# Patient Record
Sex: Female | Born: 1960 | Race: White | Hispanic: No | Marital: Single | Smoking: Never smoker
Health system: Southern US, Community
[De-identification: ages and names within clinical notes are randomized; demographics above are authoritative.]

## PROBLEM LIST (undated history)

## (undated) DIAGNOSIS — E079 Disorder of thyroid, unspecified: Secondary | ICD-10-CM

## (undated) DIAGNOSIS — E669 Obesity, unspecified: Secondary | ICD-10-CM

## (undated) DIAGNOSIS — G4730 Sleep apnea, unspecified: Secondary | ICD-10-CM

## (undated) DIAGNOSIS — L509 Urticaria, unspecified: Secondary | ICD-10-CM

## (undated) DIAGNOSIS — T783XXA Angioneurotic edema, initial encounter: Secondary | ICD-10-CM

## (undated) DIAGNOSIS — E039 Hypothyroidism, unspecified: Secondary | ICD-10-CM

## (undated) HISTORY — PX: THYROIDECTOMY: SHX17

## (undated) HISTORY — DX: Hypothyroidism, unspecified: E03.9

## (undated) HISTORY — PX: KNEE SURGERY: SHX244

## (undated) HISTORY — DX: Disorder of thyroid, unspecified: E07.9

## (undated) HISTORY — DX: Sleep apnea, unspecified: G47.30

## (undated) HISTORY — DX: Obesity, unspecified: E66.9

## (undated) HISTORY — DX: Urticaria, unspecified: L50.9

## (undated) HISTORY — DX: Angioneurotic edema, initial encounter: T78.3XXA

---

## 1998-01-25 ENCOUNTER — Encounter: Payer: Self-pay | Admitting: Family Medicine

## 1998-01-25 ENCOUNTER — Ambulatory Visit (HOSPITAL_COMMUNITY): Admission: RE | Admit: 1998-01-25 | Discharge: 1998-01-25 | Payer: Self-pay | Admitting: Family Medicine

## 1998-03-04 ENCOUNTER — Encounter: Payer: Self-pay | Admitting: Family Medicine

## 1998-03-04 ENCOUNTER — Ambulatory Visit (HOSPITAL_COMMUNITY): Admission: RE | Admit: 1998-03-04 | Discharge: 1998-03-04 | Payer: Self-pay | Admitting: Family Medicine

## 1998-08-29 ENCOUNTER — Ambulatory Visit: Admission: RE | Admit: 1998-08-29 | Discharge: 1998-08-29 | Payer: Self-pay | Admitting: Family Medicine

## 1999-09-13 ENCOUNTER — Ambulatory Visit (HOSPITAL_COMMUNITY): Admission: RE | Admit: 1999-09-13 | Discharge: 1999-09-13 | Payer: Self-pay | Admitting: Family Medicine

## 1999-09-13 ENCOUNTER — Encounter: Payer: Self-pay | Admitting: Family Medicine

## 1999-09-27 ENCOUNTER — Encounter (INDEPENDENT_AMBULATORY_CARE_PROVIDER_SITE_OTHER): Payer: Self-pay | Admitting: Specialist

## 1999-09-27 ENCOUNTER — Encounter: Payer: Self-pay | Admitting: Surgery

## 1999-09-27 ENCOUNTER — Ambulatory Visit (HOSPITAL_COMMUNITY): Admission: RE | Admit: 1999-09-27 | Discharge: 1999-09-27 | Payer: Self-pay | Admitting: Surgery

## 1999-12-12 ENCOUNTER — Encounter: Payer: Self-pay | Admitting: Surgery

## 1999-12-12 ENCOUNTER — Ambulatory Visit (HOSPITAL_COMMUNITY): Admission: RE | Admit: 1999-12-12 | Discharge: 1999-12-12 | Payer: Self-pay | Admitting: Surgery

## 2000-01-11 ENCOUNTER — Observation Stay (HOSPITAL_COMMUNITY): Admission: RE | Admit: 2000-01-11 | Discharge: 2000-01-12 | Payer: Self-pay | Admitting: Surgery

## 2000-01-11 ENCOUNTER — Encounter (INDEPENDENT_AMBULATORY_CARE_PROVIDER_SITE_OTHER): Payer: Self-pay | Admitting: Specialist

## 2003-01-29 ENCOUNTER — Ambulatory Visit (HOSPITAL_BASED_OUTPATIENT_CLINIC_OR_DEPARTMENT_OTHER): Admission: RE | Admit: 2003-01-29 | Discharge: 2003-01-29 | Payer: Self-pay | Admitting: Obstetrics and Gynecology

## 2003-01-29 ENCOUNTER — Encounter (INDEPENDENT_AMBULATORY_CARE_PROVIDER_SITE_OTHER): Payer: Self-pay | Admitting: Specialist

## 2003-01-29 ENCOUNTER — Ambulatory Visit (HOSPITAL_COMMUNITY): Admission: RE | Admit: 2003-01-29 | Discharge: 2003-01-29 | Payer: Self-pay | Admitting: Obstetrics and Gynecology

## 2004-01-13 ENCOUNTER — Ambulatory Visit (HOSPITAL_COMMUNITY): Admission: RE | Admit: 2004-01-13 | Discharge: 2004-01-13 | Payer: Self-pay | Admitting: Family Medicine

## 2004-02-07 ENCOUNTER — Ambulatory Visit (HOSPITAL_COMMUNITY): Admission: RE | Admit: 2004-02-07 | Discharge: 2004-02-07 | Payer: Self-pay | Admitting: Neurology

## 2005-01-31 ENCOUNTER — Other Ambulatory Visit: Admission: RE | Admit: 2005-01-31 | Discharge: 2005-01-31 | Payer: Self-pay | Admitting: Endocrinology

## 2008-04-23 ENCOUNTER — Encounter: Admission: RE | Admit: 2008-04-23 | Discharge: 2008-04-23 | Payer: Self-pay | Admitting: Family Medicine

## 2008-11-13 ENCOUNTER — Encounter: Payer: Self-pay | Admitting: Pulmonary Disease

## 2008-11-13 ENCOUNTER — Encounter: Admission: RE | Admit: 2008-11-13 | Discharge: 2008-11-13 | Payer: Self-pay | Admitting: Family Medicine

## 2008-12-03 ENCOUNTER — Ambulatory Visit: Payer: Self-pay | Admitting: Pulmonary Disease

## 2008-12-03 DIAGNOSIS — G4733 Obstructive sleep apnea (adult) (pediatric): Secondary | ICD-10-CM | POA: Insufficient documentation

## 2008-12-03 DIAGNOSIS — R0602 Shortness of breath: Secondary | ICD-10-CM | POA: Insufficient documentation

## 2008-12-03 DIAGNOSIS — J309 Allergic rhinitis, unspecified: Secondary | ICD-10-CM | POA: Insufficient documentation

## 2008-12-03 DIAGNOSIS — G459 Transient cerebral ischemic attack, unspecified: Secondary | ICD-10-CM | POA: Insufficient documentation

## 2009-03-20 HISTORY — PX: THYROIDECTOMY: SHX17

## 2010-04-09 ENCOUNTER — Encounter: Payer: Self-pay | Admitting: Family Medicine

## 2010-04-21 NOTE — Assessment & Plan Note (Signed)
Summary: consult for dyspnea, night sweats.   Copy to:  Merri Brunette Primary Provider/Referring Provider:  Merri Brunette  CC:  Pulmonary Consult.  History of Present Illness: The pt is a 50y/o female who is referred for dyspnea.  The pt was in her usual state of health until the end of august, when she began to have the sudden onset of right pleuritic chest pain and low grade temperature.  The pain lasted for about 5 days, then resolved.  During this time, she had no cough, mucus, or congestion.  She denies any viral prodrome.  She did have the onset of dyspnea, and it has progressed to the point that it interferes with her ADL's.  However, her oxygen saturations have been normal at rest and with ambulation.  The pt has continued to have night sweats, and her temperature is typically 99-100 2-3 days a week.  She has been treated emperically with zpak for possible atypical infection, with no change.  She also has tried symbicort/albuterol as needed, and sees no difference.  She has had a recent cxr that is wnl.  Her bloodwork has been normal as well, including an ESR of 21.  She denies any LE edema, but did go on a long car ride to [employer] before the onset of symptoms.  Preventive Screening-Counseling & Management  Alcohol-Tobacco     Smoking Status: never  Current Medications (verified): 1)  Synthroid 100 Mcg Tabs (Levothyroxine Sodium) .... Take 1 Tablet By Mouth Once A Day  Allergies (verified): 1)  ! Codeine 2)  ! Talwin  Past History:  Past Medical History: h/o nodular thyroid--s/p partial thyroidectomy TIA (ICD-435.9) OBSTRUCTIVE SLEEP APNEA (ICD-327.23) ALLERGIC RHINITIS (ICD-477.9)    Past Surgical History: partial thyroid removed B knee surgeries.  Family History: Reviewed history and no changes required. allergies: son asthma: son heart disease: father clotting disorders: maternal grandmother  rheumatism: maternal grandmother, mother BOOP: mother   Social  History: Reviewed history and no changes required. Patient never smoked.  pt is  married. pt has 2 children. pt works part-time. self employeed. Smoking Status:  never  Review of Systems       The patient complains of shortness of breath with activity, shortness of breath at rest, non-productive cough, difficulty swallowing, and sneezing.  The patient denies productive cough, coughing up blood, chest pain, irregular heartbeats, acid heartburn, indigestion, loss of appetite, weight change, abdominal pain, sore throat, tooth/dental problems, headaches, nasal congestion/difficulty breathing through nose, itching, ear ache, anxiety, depression, hand/feet swelling, joint stiffness or pain, rash, change in color of mucus, and fever.    Vital Signs:  Patient profile:   50 year old female Height:      62.5 inches Weight:      216 pounds BMI:     39.02 O2 Sat:      96 % on Room air Temp:     98.0 degrees F oral Pulse rate:   105 / minute BP sitting:   102 / 64  (left arm) Cuff size:   large  Vitals Entered By: Arman Filter LPN (December 03, 2008 2:34 PM)  O2 Flow:  Room air CC: Pulmonary Consult Comments Medications reviewed with patient Arman Filter LPN  December 03, 2008 2:48 PM    Physical Exam  General:  ow female in nad Eyes:  PERRLA and EOMI.   Nose:  patent without discharge Mouth:  mild elongation of soft palate and uvula Neck:  prominent right lobe of thyroid, no jvd  or LN Lungs:  totally clear to auscultation Heart:  rrr, no mrg Abdomen:  soft and nontender, bs+ Extremities:  no edema noted, pulses intact distally Neurologic:  alert and oriented, moves all 4.   Impression & Recommendations:  Problem # 1:  DYSPNEA (ICD-786.05) the pt's history is very difficult to put together with one unifying diagnosis.  One possibility is an acute viral illness of some type that is slowly resolving.  Coxsackie virus can cause viral pleurisy and other pulmonary symptoms.   However, it usually doesn't result in peristent doe.  Would consider an occult PE given the sudden nature, but her sats have been excellent even with ambulation.  At this point, I think we should be conservative and see if all of this resolves in the next 1-2 weeks.  A ct chest could be done to put a lot of issues to rest, but after a discussion, she and I both agree to give this a little more time to resolve on its own.  Medications Added to Medication List This Visit: 1)  Synthroid 100 Mcg Tabs (Levothyroxine sodium) .... Take 1 tablet by mouth once a day  Other Orders: Consultation Level IV (95621)  Patient Instructions: 1)  will give this more time and see how things go over the next one week.  If persistent symptoms, would check ct chest for completeness.

## 2010-08-05 NOTE — Op Note (Signed)
   NAME:  Nancy Valencia, Nancy Valencia                          ACCOUNT NO.:  [id number]   MEDICAL RECORD NO.:  [id number]                   PATIENT TYPE:  AMB   LOCATION:  [facility]                                 FACILITY:  [facility]   PHYSICIAN:  Katherine Roan, M.D.               DATE OF BIRTH:  Aug 10, 1960   DATE OF PROCEDURE:  01/29/2003  DATE OF DISCHARGE:                                 OPERATIVE REPORT   PREOPERATIVE DIAGNOSES:  Menorrhagia and dysmenorrhea.   POSTOPERATIVE DIAGNOSES:  Menorrhagia and dysmenorrhea.   OPERATION:  Pelvic examination under anesthesia, D&C hysteroscopy with  resection of myoma and endometrial resection.   The patient was placed in lithotomy position, prepped and draped in the  usual fashion, the cervix was grasped with a tenaculum, brought to the  introitus. The uterus appeared to be normal size and shape, no adnexal  masses were noted. The cervix was then progressively dilated carefully after  10 units of Pitressin was instilled in each side of the cervix. I then  inserted the hysteroscope, there was a lower segment myoma that was right  just at the lower segment posteriorly and I resected this and did a gentle  endometrial resection. All the resected material was sent to the lab for  study and no unusual blood loss occurred. [name] tolerated this  procedure well with minimal blood loss.                                               Katherine Roan, M.D.    SDM/MEDQ  D:  01/29/2003  T:  01/29/2003  Job:  [id number]

## 2010-08-05 NOTE — Op Note (Signed)
[facility]  Patient:    CECILEY, BUIST                   MRN: [id number] Proc. Date: 01/11/00 Adm. Date:  [id number] Attending:  Katha Cabal CC:         Katherine Roan, M.D.  Robert A. Eliezer Lofts., M.D.   Operative Report  PREOPERATIVE DIAGNOSIS:  Cold nodule, left lobe of thyroid, with multinodular goiter.  POSTOPERATIVE DIAGNOSIS:  Multinodular goiter.  PROCEDURE:  Left thyroid lobectomy and isthmusectomy and biopsy of right thyroid nodule.  SURGEON:  Thornton Park. Daphine Deutscher, M.D.  ASSISTANT:  Zigmund Daniel, M.D.  DESCRIPTION OF PROCEDURE:  Ms. Mcauley was taken to OR #4 and placed in the sniffing position with a roll between her shoulders and her neck hyperextended.  She was prepped with Betadine and draped sterilely after the endotracheal anesthesia was administered.  I made an incision along the skin lines from sternocleidomastoid to sternocleidomastoid and divided the platysma.  I elevated superior and inferior flaps beneath the platysma.  I used sharp dissection.  I then dissected between the muscles and retracted the strap muscles laterally and got on top of the gland.  I entered the capsule of the left thyroid lobe and began mobilizing it.  I stayed right down on the gland and mobilized it superiorly, inferiorly, and laterally.  The middle thyroid vein was ligated.  The gland was fairly large and was mobilized medially.  I stayed down on the gland, identified the inferior parathyroid below and identified the superior thyroid artery.  I identified the recurrent laryngeal nerve as it appeared to course behind the artery and going up toward the entrance into the neck.  I subsequently followed that up as it went behind all these structures and since I stayed on the gland, I did not get on its insertion point into the trachea.  I took the superior pole vessels.  Again, all vascular structures were at least double-clipped and  divided.  Small clips were used.  The gland was thusly mobilized and as I identified the recurrent laryngeal nerve and stayed on the gland, I was able to bring it across the midline and transect it through the isthmus, removing the left lobe and the isthmus.  There was also a nodule on the right lobe that I mobilized from the right lobe and because it was hard, I got beneath it with a Kelly clamp and oversewed that with a running horizontal mattress suture.  These both were sent for frozen section.  It came back as a multinodular goiter.  It was irrigated with saline.  No active bleeding was seen.  I had previously put a little stitch at an area of tissue down near the trachea with 4-0 Vicryl.  The area was irrigated and examined, and again no active bleeding was noted after sitting there and watching it for about 20 minutes as I waited on the frozen section.  The strap muscles were pulled together in the midline with 4-0 Vicryl, and the platysma were approximated with 4-0 Vicryl.  The skin was then approximated with benzoin and Steri-Strips.  The patient seemed to tolerate the procedure well and was taken to the recovery room in satisfactory condition. DD:  01/11/00 TD:  01/11/00 Job: [id number]

## 2012-07-31 ENCOUNTER — Other Ambulatory Visit: Payer: Self-pay | Admitting: Internal Medicine

## 2012-07-31 ENCOUNTER — Ambulatory Visit
Admission: RE | Admit: 2012-07-31 | Discharge: 2012-07-31 | Disposition: A | Discharge status: Home / Self Care | Payer: BC Managed Care – PPO | Source: Ambulatory Visit | Attending: Internal Medicine | Admitting: Internal Medicine

## 2012-07-31 DIAGNOSIS — E042 Nontoxic multinodular goiter: Secondary | ICD-10-CM

## 2012-08-07 ENCOUNTER — Other Ambulatory Visit: Payer: Self-pay | Admitting: Internal Medicine

## 2012-08-07 DIAGNOSIS — E041 Nontoxic single thyroid nodule: Secondary | ICD-10-CM

## 2012-08-14 ENCOUNTER — Other Ambulatory Visit (HOSPITAL_COMMUNITY)
Admission: RE | Admit: 2012-08-14 | Discharge: 2012-08-14 | Disposition: A | Discharge status: Home / Self Care | Payer: BC Managed Care – PPO | Source: Ambulatory Visit | Attending: Interventional Radiology | Admitting: Interventional Radiology

## 2012-08-14 ENCOUNTER — Ambulatory Visit
Admission: RE | Admit: 2012-08-14 | Discharge: 2012-08-14 | Disposition: A | Discharge status: Home / Self Care | Payer: BC Managed Care – PPO | Source: Ambulatory Visit | Attending: Internal Medicine | Admitting: Internal Medicine

## 2012-08-14 DIAGNOSIS — E041 Nontoxic single thyroid nodule: Secondary | ICD-10-CM

## 2012-08-14 DIAGNOSIS — E049 Nontoxic goiter, unspecified: Secondary | ICD-10-CM | POA: Insufficient documentation

## 2013-04-10 ENCOUNTER — Other Ambulatory Visit: Payer: Self-pay | Admitting: Orthopedic Surgery

## 2013-04-10 DIAGNOSIS — M25561 Pain in right knee: Secondary | ICD-10-CM

## 2013-04-14 ENCOUNTER — Ambulatory Visit
Admission: RE | Admit: 2013-04-14 | Discharge: 2013-04-14 | Disposition: A | Discharge status: Home / Self Care | Payer: BC Managed Care – PPO | Source: Ambulatory Visit | Attending: Orthopedic Surgery | Admitting: Orthopedic Surgery

## 2013-04-14 DIAGNOSIS — M25561 Pain in right knee: Secondary | ICD-10-CM

## 2013-11-22 ENCOUNTER — Encounter: Payer: Self-pay | Admitting: *Deleted

## 2015-06-22 ENCOUNTER — Ambulatory Visit (INDEPENDENT_AMBULATORY_CARE_PROVIDER_SITE_OTHER): Payer: BLUE CROSS/BLUE SHIELD | Admitting: Allergy and Immunology

## 2015-06-22 ENCOUNTER — Encounter: Payer: Self-pay | Admitting: Allergy and Immunology

## 2015-06-22 VITALS — BP 110/88 | HR 92 | Resp 18

## 2015-06-22 DIAGNOSIS — J4531 Mild persistent asthma with (acute) exacerbation: Secondary | ICD-10-CM | POA: Diagnosis not present

## 2015-06-22 DIAGNOSIS — H1010 Acute atopic conjunctivitis, unspecified eye: Secondary | ICD-10-CM

## 2015-06-22 DIAGNOSIS — K219 Gastro-esophageal reflux disease without esophagitis: Secondary | ICD-10-CM

## 2015-06-22 DIAGNOSIS — J111 Influenza due to unidentified influenza virus with other respiratory manifestations: Secondary | ICD-10-CM | POA: Diagnosis not present

## 2015-06-22 DIAGNOSIS — J309 Allergic rhinitis, unspecified: Secondary | ICD-10-CM

## 2015-06-22 MED ORDER — ALBUTEROL SULFATE HFA 108 (90 BASE) MCG/ACT IN AERS: INHALATION_SPRAY | RESPIRATORY_TRACT | Status: DC

## 2015-06-22 MED ORDER — METHYLPREDNISOLONE ACETATE 80 MG/ML IJ SUSP
80.0000 mg | Freq: Once | INTRAMUSCULAR | Status: AC
  Administered 2015-06-22: 80 mg via INTRAMUSCULAR

## 2015-06-22 MED ORDER — MONTELUKAST SODIUM 10 MG PO TABS: ORAL_TABLET | ORAL | Status: DC

## 2015-06-22 MED ORDER — FLUTICASONE FUROATE 200 MCG/ACT IN AEPB: 1.0000 | INHALATION_SPRAY | Freq: Every day | RESPIRATORY_TRACT | Status: DC

## 2015-06-22 NOTE — Patient Instructions (Signed)
  1. Depo-Medrol 80 IM delivered in clinic  2. Treat inflammation:   A. Arnuity 200 one inhalation 1 time per day  B. OTC Rhinocort one spray each nostril one time per day  C. montelukast 10 mg one tablet one time per day  3. Treat reflux:   A. famotidine 10 mg one time a day  4. If needed:   A. ProAir HFA 2 puffs every 4-6 hours  B. Mucinex DM 2 tablets twice a day  C. Zyrtec 10 mg one tablet once a day  5. Further treatment?  6. Return to clinic in 1 year or earlier if problem

## 2015-06-22 NOTE — Progress Notes (Signed)
Follow-up Note  Referring Provider: Merri BrunetteSmith, Candace, MD Primary Provider: Allean FoundSMITH,CANDACE THIELE, MD Date of Office Visit: 06/22/2015  Subjective:   Nancy Valencia (DOB: 1960/04/27) is a 55 y.o. female [facility] in re-evaluation of the following:  HPI Comments: Larita FifeLynn returns to this clinic in evaluation of her asthma and allergic rhinitis and history of reflux. She's not been seen in this clinic in 1-1/2 years and I have not seen her in approximately 5 years. She is really done quite well with her respiratory tract and is tapered off all medications. She's had very little issues with asthma and does not need to use a systemic steroid and does not need to use a bronchodilator to control this agent for years. As well, her nose is been doing quite well without any problem and she's had no issues with reflux. She did have a history of food allergy directed against nuts but apparently she can eat them now without any difficulty.  Unfortunately, Larita FifeLynn developed influenza about 2 weeks ago with high fever for multiple days requiring the administration of Tamiflu. Since then she's had ear fullness and sneezing and nasal congestion and coughing. She has unrelenting coughing with gagging. She does not have any anosmia or ugly nasal discharge or ugly sputum production or chest pain or recurrent fever. She has developed a rash on her hands over the course the past several days.     Medication List           aspirin 81 MG chewable tablet  Chew by mouth daily.     calcium carbonate 600 MG Tabs tablet  Commonly known as:  OS-CAL  Take 600 mg by mouth 2 (two) times daily with a meal.     Fish Oil 1200 MG Caps  Take by mouth.     folic acid 1 MG tablet  Commonly known as:  FOLVITE  Take 1 mg by mouth daily.     GLUCOSAMINE 1500 COMPLEX PO  Take by mouth.     SYNTHROID PO  Take 95 mcg by mouth daily.     vitamin C 500 MG tablet  Commonly  known as:  ASCORBIC ACID  Take 500 mg by mouth daily.     Vitamin D 2000 units tablet  Take 2,000 Units by mouth daily.        Past Medical History  Diagnosis Date  . Sleep apnea   . Obesity   . Thyroid disease     Past Surgical History  Procedure Laterality Date  . Thyroidectomy    . Knee surgery      Allergies  Allergen Reactions  . Codeine   . Talwin [Pentazocine]     Review of systems negative except as noted in HPI / PMHx or noted below:  Review of Systems  Constitutional: Negative.   HENT: Negative.   Eyes: Negative.   Respiratory: Negative.   Cardiovascular: Negative.   Gastrointestinal: Negative.   Genitourinary: Negative.   Musculoskeletal: Negative.   Skin: Negative.   Neurological: Negative.   Endo/Heme/Allergies: Negative.   Psychiatric/Behavioral: Negative.      Objective:   Filed Vitals:   06/22/15 1635  BP: 110/88  Pulse: 92  Resp: 18          Physical Exam  Constitutional: She is well-developed, well-nourished, and in no distress.  HENT:  Head: Normocephalic.  Right Ear: External ear and ear canal normal. A middle ear effusion is present.  Left Ear: External ear and ear canal normal. A middle ear effusion is present.  Nose: Mucosal edema (Erythematous) present. No rhinorrhea.  Mouth/Throat: Uvula is midline, oropharynx is clear and moist and mucous membranes are normal. No oropharyngeal exudate.  Eyes: Conjunctivae are normal.  Neck: Trachea normal. No tracheal tenderness present. No tracheal deviation present. No thyromegaly present.  Cardiovascular: Normal rate, regular rhythm, S1 normal, S2 normal and normal heart sounds.   No murmur heard. Pulmonary/Chest: Breath sounds normal. No stridor. No respiratory distress. She has no wheezes. She has no rales.  Musculoskeletal: She exhibits no edema.  Lymphadenopathy:       Head (right side): No tonsillar adenopathy present.       Head (left side): No tonsillar adenopathy present.     She has no cervical adenopathy.  Neurological: She is alert. Gait normal.  Skin: Rash (Erythematous nodular papular d dermatitis located on dorsum of both hands) noted. She is not diaphoretic. No erythema. Nails show no clubbing.  Psychiatric: Mood and affect normal.    Diagnostics:    Spirometry was performed and demonstrated an FEV1 of 2.17 at 91 % of predicted.  The patient had an Asthma Control Test with the following results: ACT Total Score: 22.    Assessment and Plan:   1. Asthma, not well controlled, mild persistent, with acute exacerbation   2. Allergic rhinoconjunctivitis   3. Gastroesophageal reflux disease, esophagitis presence not specified   4. Influenza caused by unspecified influenza virus     1. Depo-Medrol 80 IM delivered in clinic  2. Treat inflammation:   A. Arnuity 200 one inhalation 1 time per day  B. OTC Rhinocort one spray each nostril one time per day  C. montelukast 10 mg one tablet one time per day  3. Treat reflux:   A. famotidine 10 mg one time a day  4. If needed:   A. ProAir HFA 2 puffs every 4-6 hours  B. Mucinex DM 2 tablets twice a day  C. Zyrtec 10 mg one tablet once a day  5. Further treatment?  6. Return to clinic in 1 year or earlier if problem  Larita Fife had been doing very well without any medications directed against her respiratory tract disease or reflux but unfortunately has developed influenza with secondary inflammation of her respiratory tract for which we'll get her to use the therapy mentioned above and assume she will do well and see her back in this clinic in 1 year or earlier if problem. Once she resolved all of her respiratory tract symptoms she can attempt to discontinue her anti-inflammatory agents for her respiratory tract and her therapy for reflux. Certainly if she has problems in the face of this therapy she can contact me for further evaluation and treatment.  Laurette Schimke, MD [facility]

## 2015-07-09 ENCOUNTER — Telehealth: Payer: Self-pay

## 2015-07-09 MED ORDER — PREDNISONE 10 MG PO TABS: 10.0000 mg | ORAL_TABLET | Freq: Two times a day (BID) | ORAL | Status: DC

## 2015-07-09 MED ORDER — AMOXICILLIN-POT CLAVULANATE 875-125 MG PO TABS: 1.0000 | ORAL_TABLET | Freq: Two times a day (BID) | ORAL | Status: DC

## 2015-07-09 NOTE — Telephone Encounter (Signed)
Spoke with patient advised of written below sent medications in to pharmacy. Patient verbalized understanding if not feeling better she will contact us

## 2015-07-09 NOTE — Telephone Encounter (Signed)
Last seen on 06/22/15-Kozlow.  She is still having some coughing and congestion issues.  Please Advise    [facility]

## 2015-07-09 NOTE — Telephone Encounter (Signed)
Please advise 

## 2015-07-09 NOTE — Telephone Encounter (Signed)
Please have patient use Augmentin 875 twice a day for 10 days and prednisone 10mg  twice a day for 5 days

## 2016-10-19 ENCOUNTER — Ambulatory Visit (INDEPENDENT_AMBULATORY_CARE_PROVIDER_SITE_OTHER): Payer: BLUE CROSS/BLUE SHIELD | Admitting: Allergy and Immunology

## 2016-10-19 ENCOUNTER — Encounter: Payer: Self-pay | Admitting: Allergy and Immunology

## 2016-10-19 VITALS — BP 138/78 | HR 96 | Temp 97.9°F | Resp 18 | Ht 60.83 in | Wt 212.6 lb

## 2016-10-19 DIAGNOSIS — J4520 Mild intermittent asthma, uncomplicated: Secondary | ICD-10-CM | POA: Diagnosis not present

## 2016-10-19 DIAGNOSIS — J3089 Other allergic rhinitis: Secondary | ICD-10-CM

## 2016-10-19 DIAGNOSIS — T782XXA Anaphylactic shock, unspecified, initial encounter: Secondary | ICD-10-CM

## 2016-10-19 MED ORDER — MONTELUKAST SODIUM 10 MG PO TABS: 10.0000 mg | ORAL_TABLET | Freq: Every day | ORAL | 5 refills | Status: DC

## 2016-10-19 MED ORDER — EPINEPHRINE 0.3 MG/0.3ML IJ SOAJ: 0.3000 mg | Freq: Once | INTRAMUSCULAR | 3 refills | Status: AC

## 2016-10-19 MED ORDER — RANITIDINE HCL 150 MG PO TABS: 150.0000 mg | ORAL_TABLET | Freq: Two times a day (BID) | ORAL | 5 refills | Status: DC

## 2016-10-19 MED ORDER — CETIRIZINE HCL 10 MG PO TABS: 10.0000 mg | ORAL_TABLET | Freq: Two times a day (BID) | ORAL | 5 refills | Status: DC

## 2016-10-19 NOTE — Progress Notes (Signed)
NEW PATIENT NOTE  Referring Provider: No ref. provider found Primary Provider: Merri BrunetteSmith, Candace, MD Date of office visit: 10/19/2016    Subjective:   Chief Complaint:  Nancy Valencia (DOB: 1961-03-20) is a 56 y.o. female who presents to the clinic on 10/19/2016 with a chief complaint of Allergic Reaction (sneezing, cough, wheezing, hives after eating, shortness of breath, throat swelling) .  HPI: Nancy Valencia presents to this clinic in evaluation of recurrent reactions. I had initially seen her in this clinic about 3 years ago for a one-month history of recurrent hives and respiratory tract symptoms that resolved without an obvious provoking factor other than the fact that exposure to various fragrances appeared to precipitate this issue at that point in time.  She did very well up until 6 weeks ago. Now she develops episodes of sneezing and unrelenting coughing spells about 3-5 times per week associated with posttussive emesis and the sensation that her throat is closing with wheezing that can last up to 2 hours. She will take Benadryl and this may help her reaction resolve within 30-60 minutes. She recently visited with her doctor who gave her Ventolin and Flonase and Zyrtec over the course the past week. It is possible that Ventolin may help her cough somewhat. In addition to this issue with her respiratory tract she gets itchy hands in a radial distribution bilaterally and she will get some watery eyes with some of these episodes. On occasion she will develope sneezing and watery eyes unrelated to these other issues.  There is no obvious provoking factor giving rise to this issue but it does appear as though some of her coughing episodes appear to occur around the time of preparing food or eating food. There is no obvious food that has given rise to this issue. Initially she thought it might have been consumption of eggs and chicken and soy and gluten but she is eating these foods on other times  with no problem.  Past Medical History:  Diagnosis Date  . Angio-edema   . Hypothyroid   . Urticaria     Past Surgical History:  Procedure Laterality Date  . KNEE SURGERY Left 1987 and 1999  . THYROIDECTOMY  2011    Allergies as of 10/19/2016      Reactions   Codeine    Pentazocine Lactate       Medication List      cetirizine 10 MG tablet Commonly known as:  ZYRTEC Take 10 mg by mouth at bedtime.   fluticasone 50 MCG/ACT nasal spray Commonly known as:  FLONASE Place 2 sprays into both nostrils daily.   NATURE-THROID PO Take 1 tablet by mouth daily.   VENTOLIN HFA 108 (90 Base) MCG/ACT inhaler Generic drug:  albuterol Inhale 2 puffs into the lungs every 4 (four) hours as needed for wheezing or shortness of breath.       Review of systems negative except as noted in HPI / PMHx or noted below:  Review of Systems  Constitutional: Negative.   HENT: Negative.   Eyes: Negative.   Respiratory: Negative.   Cardiovascular: Negative.   Gastrointestinal: Negative.   Genitourinary: Negative.   Musculoskeletal: Negative.   Skin: Negative.   Neurological: Negative.   Endo/Heme/Allergies: Negative.   Psychiatric/Behavioral: Negative.     Family History  Problem Relation Age of Onset  . Lung disease Mother   . Heart disease Father   . Diabetes Brother   . Stroke Maternal Grandmother   .  Heart attack Maternal Grandfather   . Liver disease Paternal Grandfather     Social History   Social History  . Marital status: Single    Spouse name: N/A  . Number of children: N/A  . Years of education: N/A   Occupational History  . Not on file.   Social History Main Topics  . Smoking status: Never Smoker  . Smokeless tobacco: Never Used  . Alcohol use No  . Drug use: No  . Sexual activity: Not on file   Other Topics Concern  . Not on file   Social History Narrative  . No narrative on file    Environmental and Social history  Lives in a house with a dry  environment, dogs located inside the household, carpeting in the bedroom, no plastic on the bed, no plastic on the pillow, and no smoking ongoing with inside the household.  Objective:   Vitals:   10/19/16 0922  BP: 138/78  Pulse: 96  Resp: 18  Temp: 97.9 F (36.6 C)   Height: 5' 0.83" (154.5 cm) Weight: 212 lb 9.6 oz (96.4 kg)  Physical Exam  Constitutional: She is well-developed, well-nourished, and in no distress.  HENT:  Head: Normocephalic. Head is without right periorbital erythema and without left periorbital erythema.  Right Ear: Tympanic membrane, external ear and ear canal normal.  Left Ear: Tympanic membrane, external ear and ear canal normal.  Nose: Nose normal. No mucosal edema or rhinorrhea.  Mouth/Throat: Uvula is midline, oropharynx is clear and moist and mucous membranes are normal. No oropharyngeal exudate.  Eyes: Pupils are equal, round, and reactive to light. Conjunctivae and lids are normal.  Neck: Trachea normal. No tracheal tenderness present. No tracheal deviation present. Thyromegaly (goiter and surgical scar) present.  Cardiovascular: Normal rate, regular rhythm, S1 normal, S2 normal and normal heart sounds.   No murmur heard. Pulmonary/Chest: Effort normal and breath sounds normal. No stridor. No tachypnea. No respiratory distress. She has no wheezes. She has no rales. She exhibits no tenderness.  Abdominal: Soft. She exhibits no distension and no mass. There is no hepatosplenomegaly. There is no tenderness. There is no rebound and no guarding.  Musculoskeletal: She exhibits no edema or tenderness.  Lymphadenopathy:       Head (right side): No tonsillar adenopathy present.       Head (left side): No tonsillar adenopathy present.    She has no cervical adenopathy.    She has no axillary adenopathy.  Neurological: She is alert. Gait normal.  Skin: No rash noted. She is not diaphoretic. No erythema. No pallor. Nails show no clubbing.  Psychiatric: Mood and  affect normal.    Diagnostics: Allergy skin tests were performed. She did not demonstrate any hypersensitivity against a screening panel of foods or aeroallergens.  Spirometry was performed and demonstrated an FEV1 of 1.94 @ 80 % of predicted. Following the administration of nebulized albuterol her FEV1 rose to 2.05 which was an increase in the FEV1 of 6%.  Assessment and Plan:    1. Anaphylaxis, initial encounter   2. Poorly controlled intermittent asthma without complication   3. Other allergic rhinitis     1. Allergen avoidance measures  2. Use preventative medications:   A. cetirizine 10 mg - one tablet twice a day  B. ranitidine 150 mg - one tablet twice a day  C. montelukast 10 mg - one tablet once a day  D. OTC Nasacort - one spray each nostril once a day  3.  If needed:   A. Ventolin HFA 2 puffs every 4-6 hours  B. AUVI-Q 0.3, Benadryl, M.D./ER evaluation for allergic reaction  4. Obtain a chest x-ray  5. Obtain blood - CBC w/diff, CMP, SED, alpha gal, tryptase, TSH, T4, TP  6. Return to clinic in 2 weeks or earlier if problem  7. Further evaluation? Yes, if recurrent  Nancy Valencia very well could be having significant systemic reactions and I will treat her with a combination therapy noted above in an attempt to blunt these reactions. In addition, she may have a anatomical abnormality of her neck irritating her airway that may be the result of regrowth of her goiter. We will obtain the investigation noted above and have her utilize the plan noted above and regroup with her in 2 weeks to consider further evaluation and treatment pending her response and the results of her diagnostic testing.  Laurette SchimkeEric Kozlow, MD Allergy / Immunology [facility]

## 2016-10-19 NOTE — Patient Instructions (Addendum)
  1. Allergen avoidance measures  2. Use preventative medications:   A. cetirizine 10 mg - one tablet twice a day  B. ranitidine 150 mg - one tablet twice a day  C. montelukast 10 mg - one tablet once a day  D. OTC Nasacort - one spray each nostril once a day  3. If needed:   A. Ventolin HFA 2 puffs every 4-6 hours  B. AUVI-Q 0.3, Benadryl, M.D./ER evaluation for allergic reaction  4. Obtain a chest x-ray  5. Obtain blood - CBC w/diff, CMP, SED, alpha gal, tryptase, TSH, T4, TP  6. Return to clinic in 2 weeks or earlier if problem  7. Further evaluation? Yes, if recurrent

## 2016-10-20 ENCOUNTER — Telehealth: Payer: Self-pay | Admitting: *Deleted

## 2016-10-23 ENCOUNTER — Other Ambulatory Visit: Payer: Self-pay | Admitting: *Deleted

## 2016-10-23 ENCOUNTER — Other Ambulatory Visit: Payer: Self-pay | Admitting: Allergy and Immunology

## 2016-10-23 DIAGNOSIS — R221 Localized swelling, mass and lump, neck: Secondary | ICD-10-CM

## 2016-10-23 NOTE — Telephone Encounter (Signed)
Linked Nancy Valencia to this patient due to same patient. Put order in to [facility] for MRI soft tissue neck CPT 70543 per MRI tech. Contacted [facility] and they advised will contact patient to make appt

## 2016-10-23 NOTE — Telephone Encounter (Signed)
Called [employer] [place] to inquire if MRI needs PA and they advised no PA required.

## 2016-10-24 ENCOUNTER — Encounter: Payer: Self-pay | Admitting: Allergy and Immunology

## 2016-10-24 LAB — CBC WITH DIFFERENTIAL/PLATELET
Basophils Absolute: 0 10*3/uL (ref 0.0–0.2)
Basos: 0 %
EOS (ABSOLUTE): 0.1 10*3/uL (ref 0.0–0.4)
Eos: 2 %
Hematocrit: 41.3 % (ref 34.0–46.6)
Hemoglobin: 14 g/dL (ref 11.1–15.9)
Immature Grans (Abs): 0 10*3/uL (ref 0.0–0.1)
Immature Granulocytes: 0 %
Lymphocytes Absolute: 2.1 10*3/uL (ref 0.7–3.1)
Lymphs: 29 %
MCH: 28.2 pg (ref 26.6–33.0)
MCHC: 33.9 g/dL (ref 31.5–35.7)
MCV: 83 fL (ref 79–97)
Monocytes Absolute: 0.5 10*3/uL (ref 0.1–0.9)
Monocytes: 7 %
Neutrophils Absolute: 4.6 10*3/uL (ref 1.4–7.0)
Neutrophils: 62 %
Platelets: 285 10*3/uL (ref 150–379)
RBC: 4.97 x10E6/uL (ref 3.77–5.28)
RDW: 13 % (ref 12.3–15.4)
WBC: 7.4 10*3/uL (ref 3.4–10.8)

## 2016-10-24 LAB — TSH: TSH: 1.38 u[IU]/mL (ref 0.450–4.500)

## 2016-10-24 LAB — COMPREHENSIVE METABOLIC PANEL
ALT: 11 IU/L (ref 0–32)
AST: 13 IU/L (ref 0–40)
Albumin/Globulin Ratio: 2 (ref 1.2–2.2)
Albumin: 4.7 g/dL (ref 3.5–5.5)
Alkaline Phosphatase: 77 IU/L (ref 39–117)
BUN/Creatinine Ratio: 14 (ref 9–23)
BUN: 11 mg/dL (ref 6–24)
Bilirubin Total: 0.4 mg/dL (ref 0.0–1.2)
CO2: 24 mmol/L (ref 20–29)
Calcium: 9.8 mg/dL (ref 8.7–10.2)
Chloride: 101 mmol/L (ref 96–106)
Creatinine, Ser: 0.8 mg/dL (ref 0.57–1.00)
GFR calc Af Amer: 97 mL/min/{1.73_m2} (ref >59)
GFR calc non Af Amer: 84 mL/min/{1.73_m2} (ref >59)
Globulin, Total: 2.4 g/dL (ref 1.5–4.5)
Glucose: 91 mg/dL (ref 65–99)
Potassium: 4.4 mmol/L (ref 3.5–5.2)
Sodium: 143 mmol/L (ref 134–144)
Total Protein: 7.1 g/dL (ref 6.0–8.5)

## 2016-10-24 LAB — ALPHA-GAL PANEL
Alpha Gal IgE*: <0.1 kU/L (ref <0.35)
Beef (Bos spp) IgE: <0.1 kU/L (ref <0.35)
Class Interpretation: 0
Class Interpretation: 0
Class Interpretation: 0
Lamb/Mutton (Ovis spp) IgE: <0.1 kU/L (ref <0.35)
Pork (Sus spp) IgE: <0.1 kU/L (ref <0.35)

## 2016-10-24 LAB — SEDIMENTATION RATE: Sed Rate: 3 mm/hr (ref 0–40)

## 2016-10-24 LAB — TRYPTASE: Tryptase: 4.1 ug/L (ref 2.2–13.2)

## 2016-10-24 LAB — THYROID PEROXIDASE ANTIBODY: Thyroperoxidase Ab SerPl-aCnc: 15 IU/mL (ref 0–34)

## 2016-10-24 LAB — T4, FREE: Free T4: 1.21 ng/dL (ref 0.82–1.77)

## 2016-10-25 ENCOUNTER — Encounter: Payer: Self-pay | Admitting: *Deleted

## 2016-11-02 ENCOUNTER — Ambulatory Visit (INDEPENDENT_AMBULATORY_CARE_PROVIDER_SITE_OTHER): Payer: BLUE CROSS/BLUE SHIELD | Admitting: Allergy and Immunology

## 2016-11-02 ENCOUNTER — Encounter: Payer: Self-pay | Admitting: Allergy and Immunology

## 2016-11-02 VITALS — BP 110/80 | HR 80 | Resp 18

## 2016-11-02 DIAGNOSIS — J4530 Mild persistent asthma, uncomplicated: Secondary | ICD-10-CM

## 2016-11-02 DIAGNOSIS — J3089 Other allergic rhinitis: Secondary | ICD-10-CM | POA: Diagnosis not present

## 2016-11-02 DIAGNOSIS — R221 Localized swelling, mass and lump, neck: Secondary | ICD-10-CM | POA: Diagnosis not present

## 2016-11-02 DIAGNOSIS — T782XXD Anaphylactic shock, unspecified, subsequent encounter: Secondary | ICD-10-CM

## 2016-11-02 MED ORDER — LORATADINE 10 MG PO TABS: 10.0000 mg | ORAL_TABLET | Freq: Two times a day (BID) | ORAL | 5 refills | Status: DC

## 2016-11-02 NOTE — Patient Instructions (Addendum)
  1. Use preventative medications:   A. Loratadine 10mg  - one tablet two times per day  B. ranitidine 150 mg - one tablet twice a day  C. montelukast 10 mg - one tablet once a day  D. OTC Nasacort - one spray each nostril once a day  2. If needed:   A. Ventolin HFA 2 puffs every 4-6 hours  B. AUVI-Q 0.3, Benadryl, M.D./ER evaluation for allergic reaction  3. Obtain MRI for thoracic mass November 09, 2016  4. Call clinic with report in 2 weeks  5. Obtain fall flu vaccine  6. Return to clinic in 12 weeks

## 2016-11-02 NOTE — Progress Notes (Signed)
Follow-up Note  Referring Provider: Merri BrunetteSmith, Candace, MD Primary Provider: Merri BrunetteSmith, Candace, MD Date of Office Visit: 11/02/2016  Subjective:   Nancy Valencia (DOB: May 29, 1960) is a 56 y.o. female [facility] in re-evaluation of the following:  HPI: Nancy FifeLynn returns to this clinic in reevaluation of her respiratory tract issues addressed during her initial evaluation of 10/19/2016.  She is better regarding her upper airway issue although she still continues to have intermittent sneezing attacks although they have decreased in intensity and frequency on her current therapy. As well, her coughing has decreased dramatically and she has not had any her bad episodes of coughing associated with posttussive emesis with the sensation that her throat is closing. She has been sedated with the use of cetirizine.  Allergies as of 11/02/2016      Reactions   Codeine    Pentazocine Lactate    Talwin [pentazocine]       Medication List      aspirin 81 MG chewable tablet Chew by mouth daily.   calcium carbonate 600 MG Tabs tablet Commonly known as:  OS-CAL Take 600 mg by mouth 2 (two) times daily with a meal.   cetirizine 10 MG tablet Commonly known as:  ZYRTEC Take 1 tablet (10 mg total) by mouth 2 (two) times daily.   Fish Oil 1200 MG Caps Take by mouth.   fluticasone 50 MCG/ACT nasal spray Commonly known as:  FLONASE Place 2 sprays into both nostrils daily.   Fluticasone Furoate 200 MCG/ACT Aepb Commonly known as:  ARNUITY ELLIPTA Inhale 1 puff into the lungs daily.   folic acid 1 MG tablet Commonly known as:  FOLVITE Take 1 mg by mouth daily.   GLUCOSAMINE 1500 COMPLEX PO Take by mouth.   loratadine 10 MG tablet Commonly known as:  CLARITIN Take 1 tablet (10 mg total) by mouth 2 (two) times daily.   montelukast 10 MG tablet Commonly known as:  SINGULAIR Take 1 tablet (10 mg total) by mouth at bedtime.   NATURE-THROID  PO Take 1 tablet by mouth daily.   ranitidine 150 MG tablet Commonly known as:  ZANTAC Take 1 tablet (150 mg total) by mouth 2 (two) times daily.   VENTOLIN HFA 108 (90 Base) MCG/ACT inhaler Generic drug:  albuterol Inhale 2 puffs into the lungs every 4 (four) hours as needed for wheezing or shortness of breath.   vitamin C 500 MG tablet Commonly known as:  ASCORBIC ACID Take 500 mg by mouth daily.   Vitamin D 2000 units tablet Take 2,000 Units by mouth daily.       Past Medical History:  Diagnosis Date  . Angio-edema   . Hypothyroid   . Obesity   . Sleep apnea   . Thyroid disease   . Urticaria     Past Surgical History:  Procedure Laterality Date  . KNEE SURGERY    . KNEE SURGERY Left 1987 and 1999  . THYROIDECTOMY    . THYROIDECTOMY  2011    Review of systems negative except as noted in HPI / PMHx or noted below:  Review of Systems  Constitutional: Negative.   HENT: Negative.   Eyes: Negative.   Respiratory: Negative.   Cardiovascular: Negative.   Gastrointestinal: Negative.   Genitourinary: Negative.   Musculoskeletal: Negative.   Skin: Negative.   Neurological: Negative.   Endo/Heme/Allergies: Negative.   Psychiatric/Behavioral: Negative.      Objective:   Vitals:  11/02/16 1057  BP: 110/80  Pulse: 80  Resp: 18          Physical Exam  Constitutional: She is well-developed, well-nourished, and in no distress.  HENT:  Head: Normocephalic.  Right Ear: Tympanic membrane, external ear and ear canal normal.  Left Ear: Tympanic membrane, external ear and ear canal normal.  Nose: Nose normal. No mucosal edema or rhinorrhea.  Mouth/Throat: Uvula is midline, oropharynx is clear and moist and mucous membranes are normal. No oropharyngeal exudate.  Eyes: Conjunctivae are normal.  Neck: Trachea normal. No tracheal tenderness present. No tracheal deviation present. Thyromegaly present.  Right-sided goiter  Cardiovascular: Normal rate, regular  rhythm, S1 normal, S2 normal and normal heart sounds.   No murmur heard. Pulmonary/Chest: Breath sounds normal. No stridor. No respiratory distress. She has no wheezes. She has no rales.  Musculoskeletal: She exhibits no edema.  Lymphadenopathy:       Head (right side): No tonsillar adenopathy present.       Head (left side): No tonsillar adenopathy present.    She has no cervical adenopathy.  Neurological: She is alert. Gait normal.  Skin: No rash noted. She is not diaphoretic. No erythema. Nails show no clubbing.  Psychiatric: Mood and affect normal.    Diagnostics: Results of blood tests obtained to August 2018 identified normal hepatic and renal function, white blood cell 7.4 with a normal differential and an absolute eosinophil count of 100, absolute lymphocyte 2100, hemoglobin 14, platelet 285, normal TSH, normal T4, thyroid peroxidase antibody 15 international units/mL, and negative alpha gal panel, tryptase 4.1 UG/L  Results of a chest x-ray obtained to August 2018 identified deviation of the trachea with tracheal narrowing in the upper thoracic region towards the left with mass in the upper right paratracheal region.   Spirometry was performed and demonstrated an FEV1 of 2.06 at 88 % of predicted.  Assessment and Plan:   1. Anaphylaxis, subsequent encounter   2. Other allergic rhinitis   3. Asthma, well controlled, mild persistent   4. Mass of right side of neck     1. Use preventative medications:   A. Loratadine 10mg  - one tablet two times per day  B. ranitidine 150 mg - one tablet twice a day  C. montelukast 10 mg - one tablet once a day  D. OTC Nasacort - one spray each nostril once a day  2. If needed:   A. Ventolin HFA 2 puffs every 4-6 hours  B. AUVI-Q 0.3, Benadryl, M.D./ER evaluation for allergic reaction  3. Obtain MRI for thoracic mass November 09, 2016  4. Call clinic with report in 2 weeks  5. Obtain fall flu vaccine  6. Return to clinic in 12  weeks  Nancy Valencia has an obvious issue with encroachment of her airway most likely secondary to regrowth of her goiter and we will define the limits of this growth with an MRI next week and we will refer her back to her surgeon for more definitive approach to this issue. She is seen Dr. Wenda Low in the past and we will send him a copy of this report. She is better in some regard regarding her other respiratory tract issues but still continues to have sneezing episodes. She is somewhat sedated while using cetirizine and we will switch her over to loratadine at this point and she will report back in a few weeks noting her response to this manipulation. She will continue to use other medications as noted above and I  will regroup with her in 12 weeks or earlier if there is a problem.  Laurette Schimke, MD Allergy / Immunology [facility]

## 2016-11-09 ENCOUNTER — Ambulatory Visit
Admission: RE | Admit: 2016-11-09 | Discharge: 2016-11-09 | Disposition: A | Discharge status: Home / Self Care | Payer: BLUE CROSS/BLUE SHIELD | Source: Ambulatory Visit | Attending: Allergy and Immunology | Admitting: Allergy and Immunology

## 2016-11-09 DIAGNOSIS — R221 Localized swelling, mass and lump, neck: Secondary | ICD-10-CM

## 2016-11-09 MED ORDER — GADOBENATE DIMEGLUMINE 529 MG/ML IV SOLN
20.0000 mL | Freq: Once | INTRAVENOUS | Status: AC | PRN
  Administered 2016-11-09: 20 mL via INTRAVENOUS

## 2016-11-16 ENCOUNTER — Other Ambulatory Visit: Payer: Self-pay | Admitting: Surgery

## 2016-11-16 DIAGNOSIS — E049 Nontoxic goiter, unspecified: Secondary | ICD-10-CM

## 2016-11-17 ENCOUNTER — Ambulatory Visit
Admission: RE | Admit: 2016-11-17 | Discharge: 2016-11-17 | Disposition: A | Discharge status: Home / Self Care | Payer: BLUE CROSS/BLUE SHIELD | Source: Ambulatory Visit | Attending: Surgery | Admitting: Surgery

## 2016-11-17 DIAGNOSIS — E049 Nontoxic goiter, unspecified: Secondary | ICD-10-CM

## 2016-11-24 ENCOUNTER — Other Ambulatory Visit: Payer: Self-pay | Admitting: Surgery

## 2016-11-24 DIAGNOSIS — E049 Nontoxic goiter, unspecified: Secondary | ICD-10-CM

## 2016-12-07 ENCOUNTER — Other Ambulatory Visit (HOSPITAL_COMMUNITY)
Admission: RE | Admit: 2016-12-07 | Discharge: 2016-12-07 | Disposition: A | Discharge status: Home / Self Care | Payer: BLUE CROSS/BLUE SHIELD | Source: Ambulatory Visit | Attending: Student | Admitting: Student

## 2016-12-07 ENCOUNTER — Ambulatory Visit
Admission: RE | Admit: 2016-12-07 | Discharge: 2016-12-07 | Disposition: A | Discharge status: Home / Self Care | Payer: BLUE CROSS/BLUE SHIELD | Source: Ambulatory Visit | Attending: Surgery | Admitting: Surgery

## 2016-12-07 DIAGNOSIS — E041 Nontoxic single thyroid nodule: Secondary | ICD-10-CM | POA: Diagnosis not present

## 2016-12-07 DIAGNOSIS — E049 Nontoxic goiter, unspecified: Secondary | ICD-10-CM

## 2016-12-27 ENCOUNTER — Encounter (HOSPITAL_COMMUNITY): Payer: Self-pay

## 2017-02-01 ENCOUNTER — Ambulatory Visit: Payer: BLUE CROSS/BLUE SHIELD | Admitting: Allergy and Immunology

## 2017-04-12 ENCOUNTER — Ambulatory Visit: Payer: BLUE CROSS/BLUE SHIELD | Admitting: Allergy and Immunology

## 2017-10-24 IMAGING — US US THYROID
1 series · 14 of 25 positions shown · non-contrast
Comparison: 07/31/2012

CLINICAL DATA: Intrathoracic goiter . Previous FNA biopsy of right
nodule 08/14/2012. Previous left thyroid lobectomy.

EXAM:
THYROID ULTRASOUND
TECHNIQUE: Ultrasound examination of the thyroid gland and adjacent soft
tissues was performed.

[Series 1: us thyroid · 0.12mm/px · 14 of 42 slices shown]
[im 1/42]
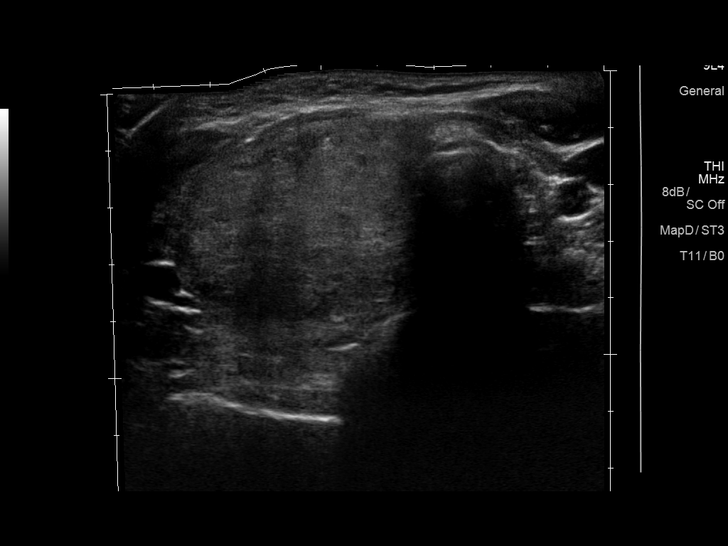
[im 4/42]
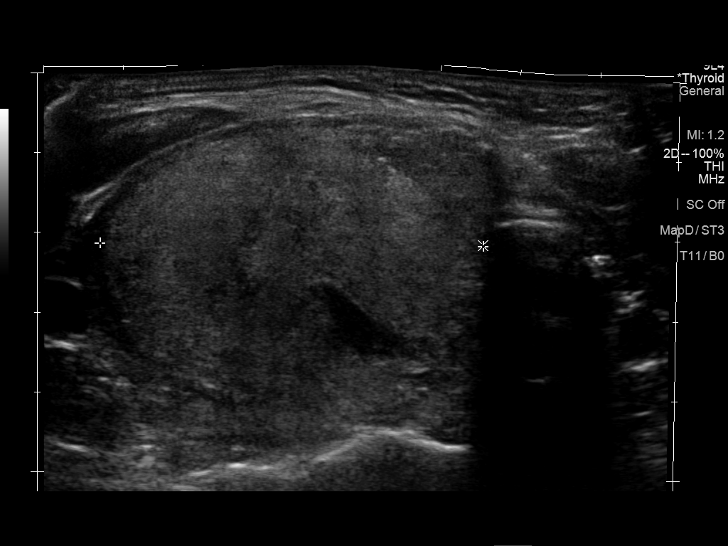
[im 7/42]
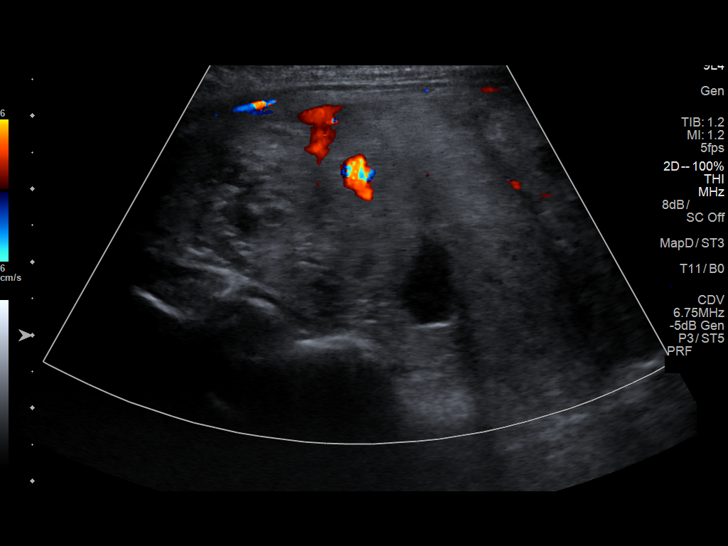
[im 11/42]
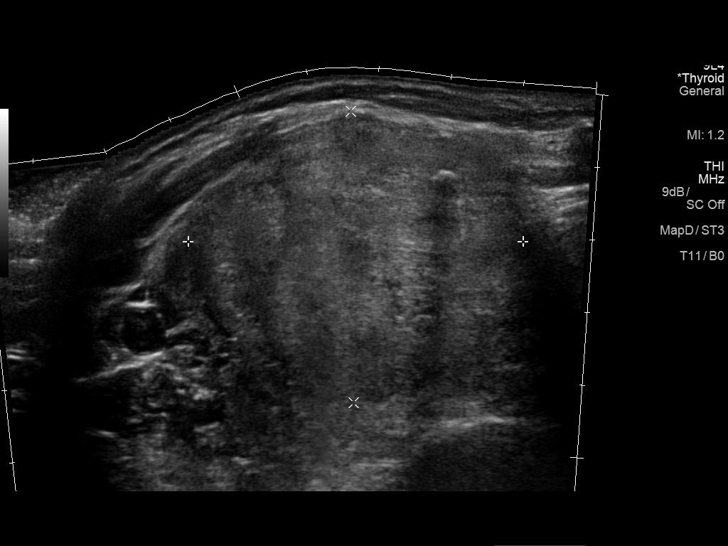
[im 14/42]
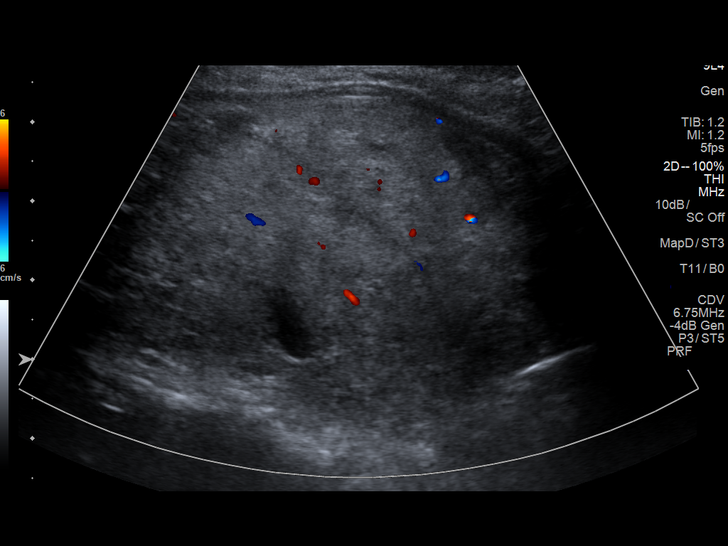
[im 16/42]
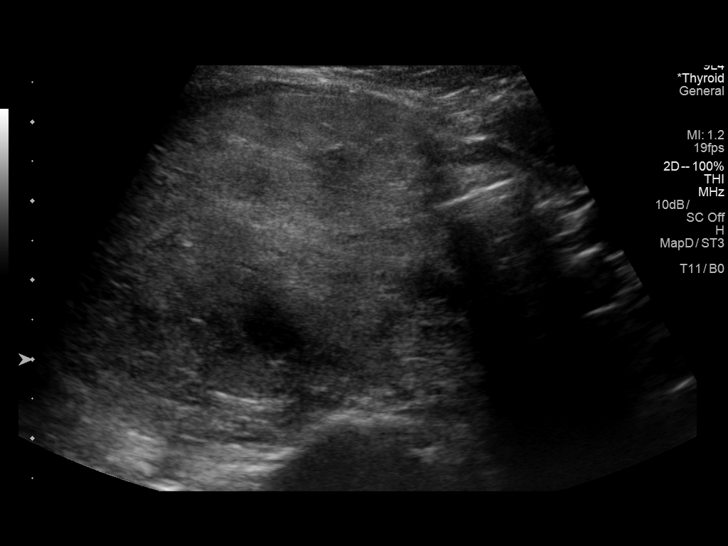
[im 19/42]
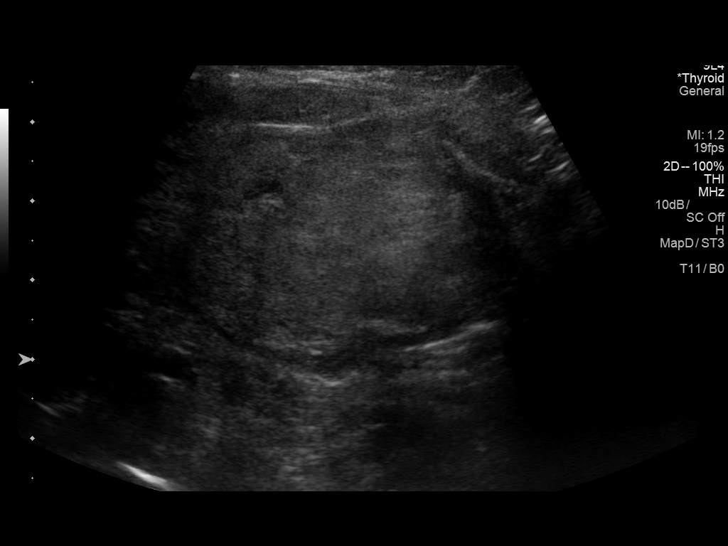
[im 23/42]
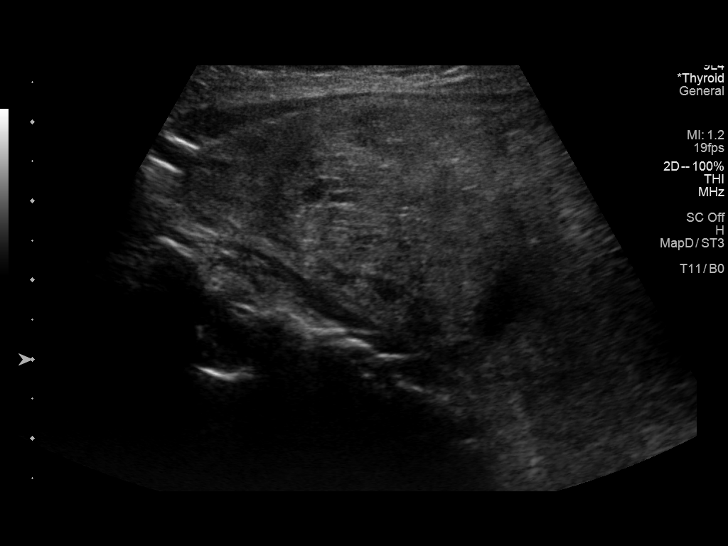
[im 26/42]
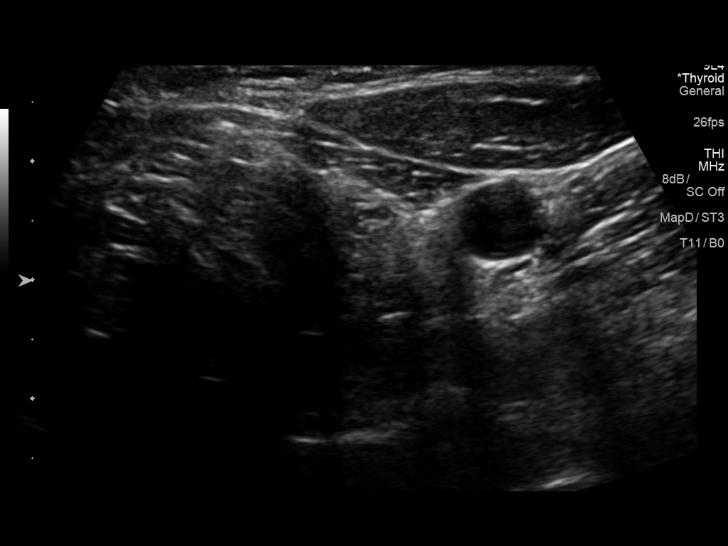
[im 28/42]
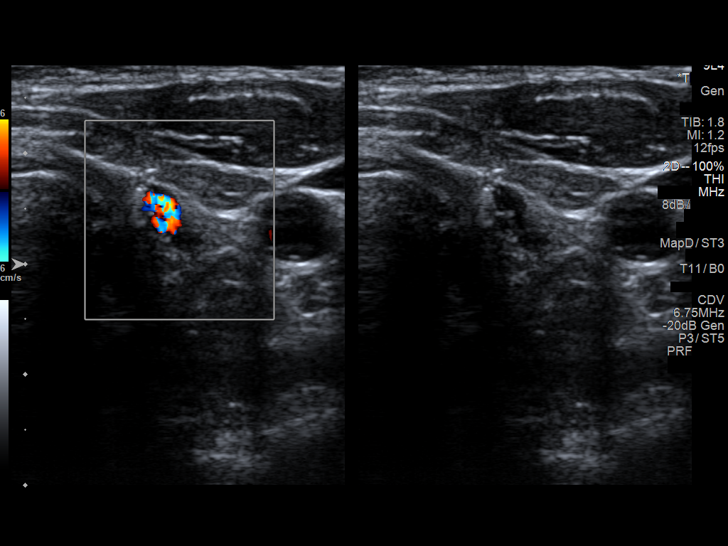
[im 31/42]
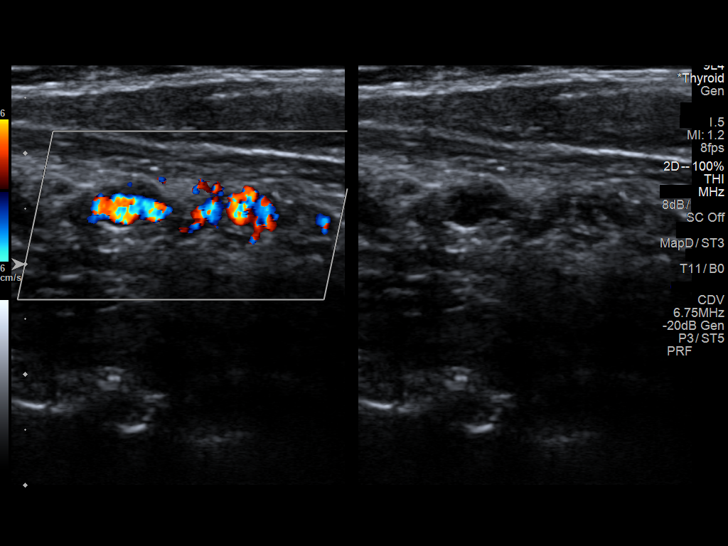
[im 35/42]
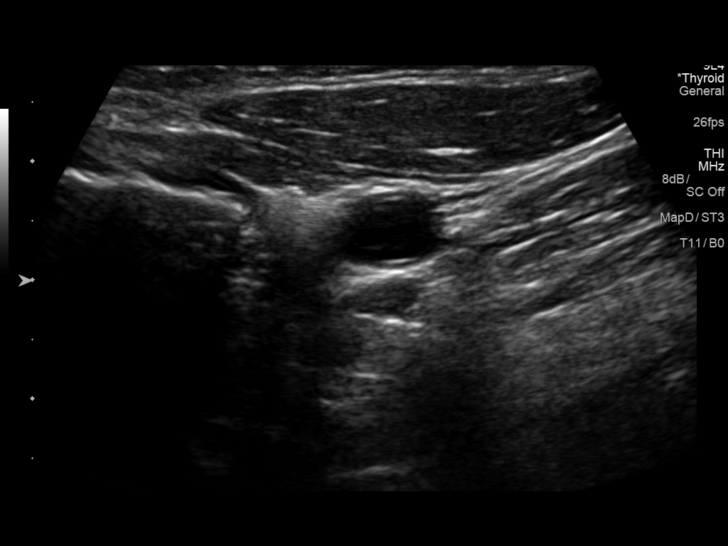
[im 38/42]
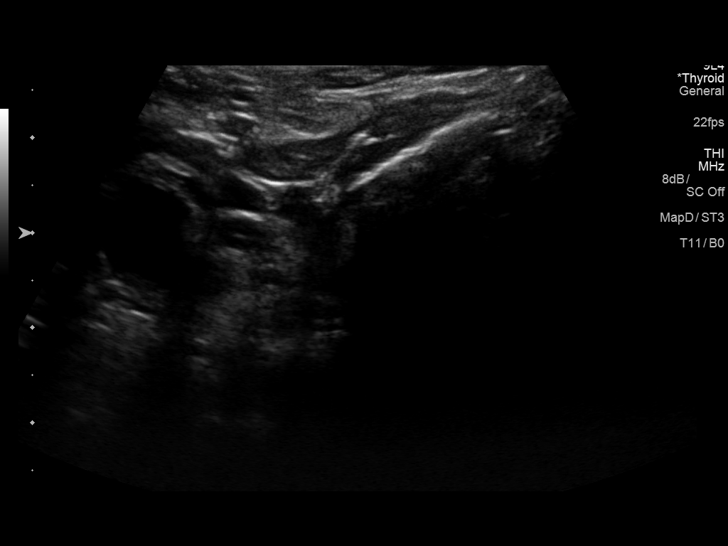
[im 42/42]
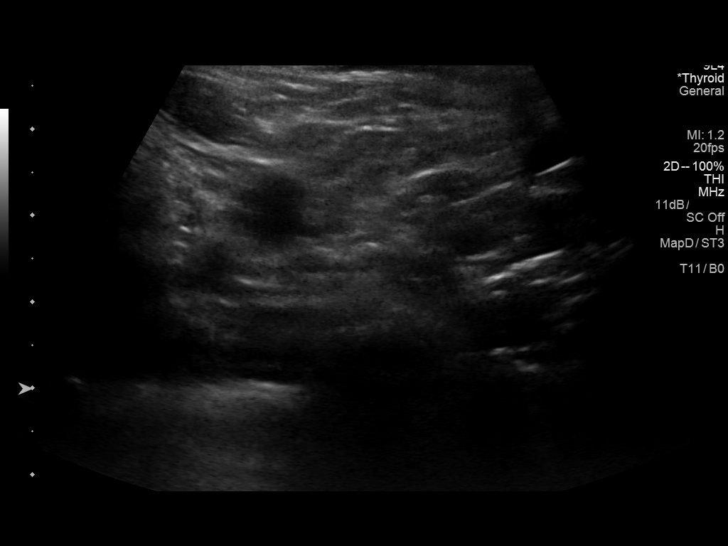

[14 of 25 positions shown; findings below may reference images not displayed]

FINDINGS: Parenchymal Echotexture: Moderately heterogenous

Isthmus: 0.3 cm thickness, stable

Right lobe:  7.5 x 4.3 x 4.8 cm, previously 7.6 x 3.4 x

Left lobe: Surgically absent

_________________________________________________________

Estimated total number of nodules >/= 1 cm: 1

Number of spongiform nodules >/=  2 cm not described below (TR1): 0

Number of mixed cystic and solid nodules >/= 1.5 cm not described
below (TR2): 0

_________________________________________________________

5.3 x 4.6 x 4 cm inferior right nodule, previously 4.6 x 3.5 x 3.2;
this was previously biopsied.
IMPRESSION: 1. No evidence of residual/recurrent tissue post left thyroid
lobectomy.
2. Enlargement of solitary right nodule, previously biopsied
08/14/2012.

The above is in keeping with the ACR TI-RADS recommendations - [HOSPITAL] 1274;[DATE].

## 2017-11-13 IMAGING — US US THYROID BIOPSY
1 series · 13 of 16 positions shown · non-contrast
Comparison: US THYROID 11/17/2016

MEDICATIONS:
3 mL 1% lidocaine

COMPLICATIONS:
None immediate.

INDICATION: Indeterminate thyroid nodule of the right thyroid. Request is made
for fine-needle aspiration of indeterminate thyroid nodule.

EXAM:
ULTRASOUND GUIDED FINE NEEDLE ASPIRATION OF INDETERMINATE THYROID
NODULE
TECHNIQUE: Informed written consent was obtained from the patient after a
discussion of the risks, benefits and alternatives to treatment.
Questions regarding the procedure were encouraged and answered. A
timeout was performed prior to the initiation of the procedure.

[Series 1: us thyroid biopsy · 0.08mm/px · 16 acquisitions, 13 frames shown]
[im 1/16]
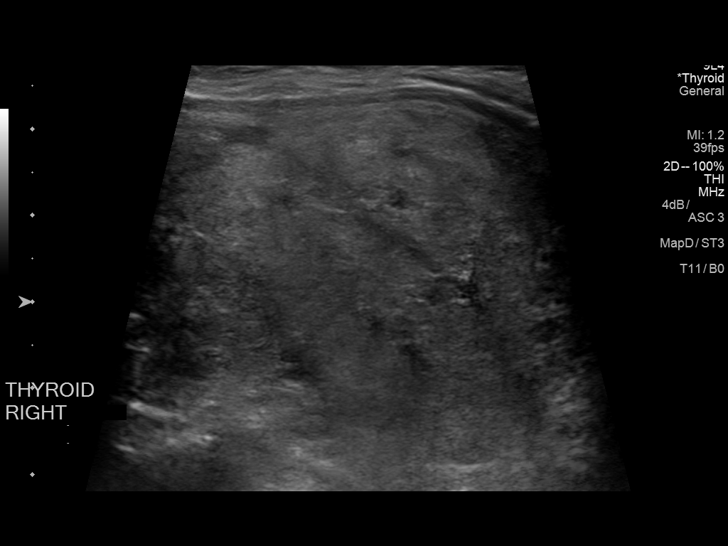
[im 2/16]
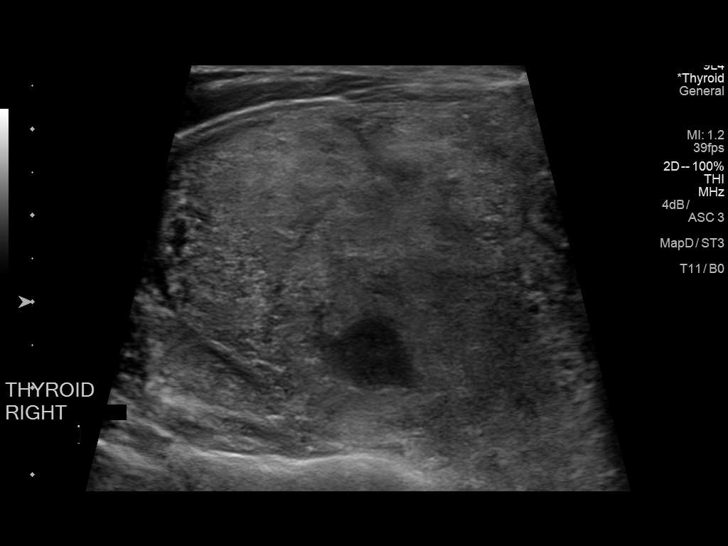
[im 4/16]
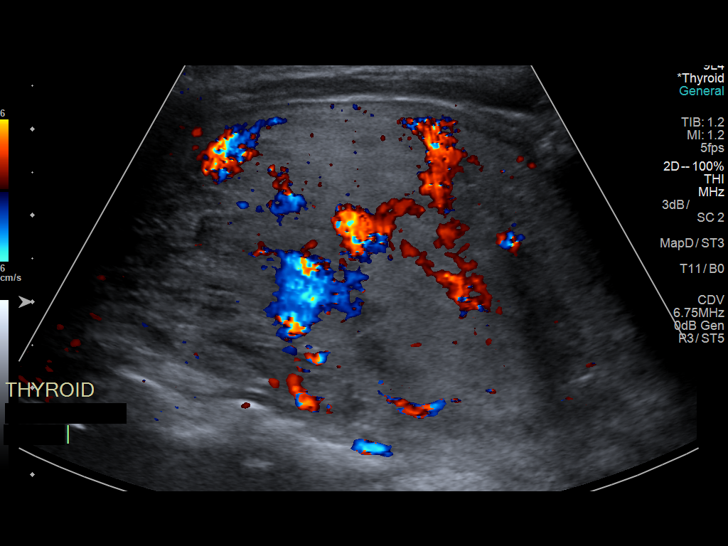
[im 5/16]
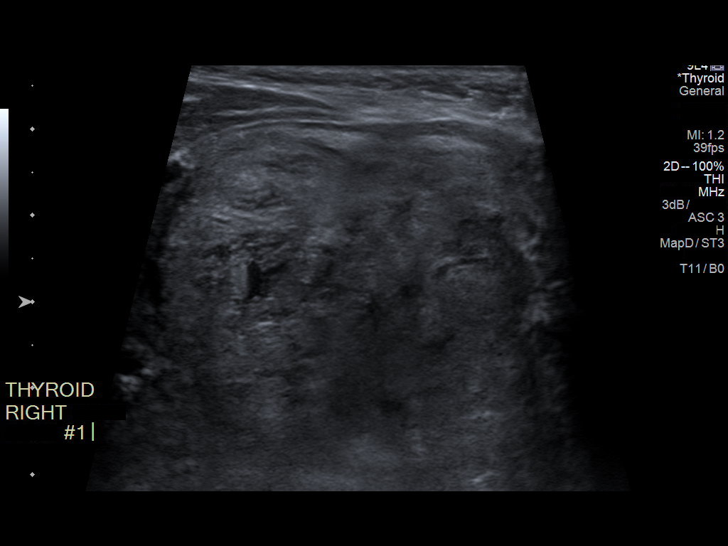
[im 6/16]
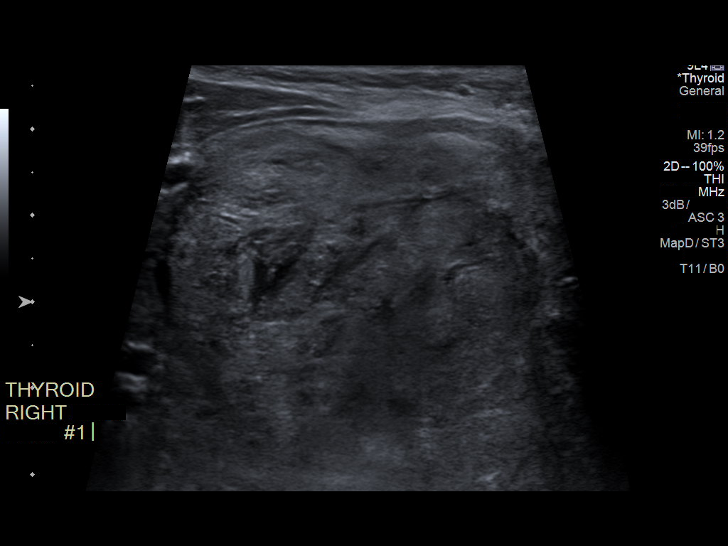
[im 7/16]
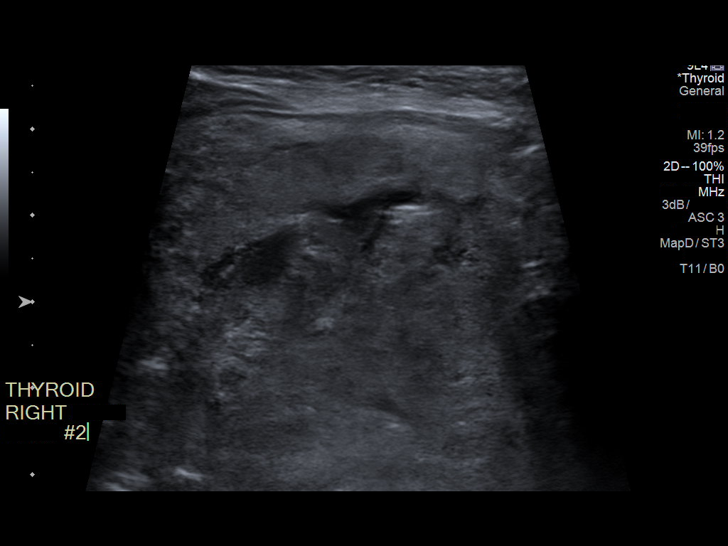
[im 9/16]
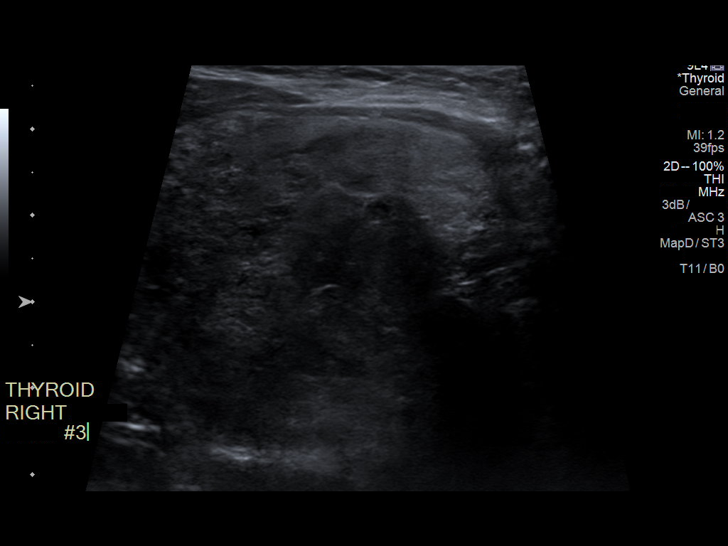
[im 10/16]
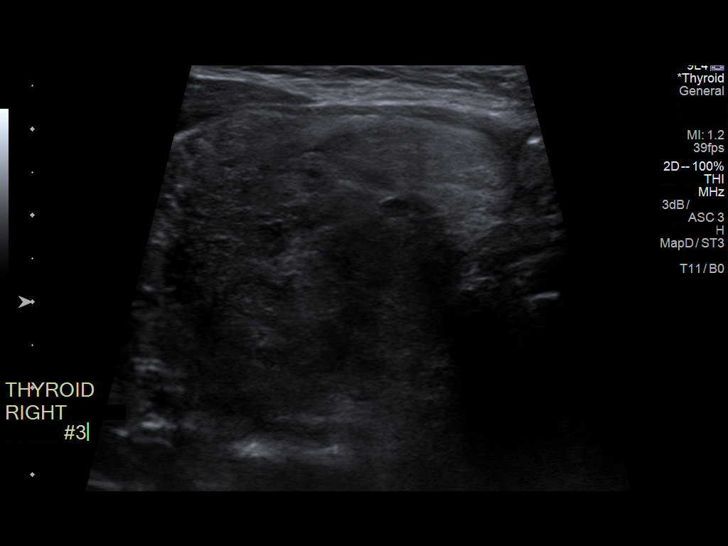
[im 11/16]
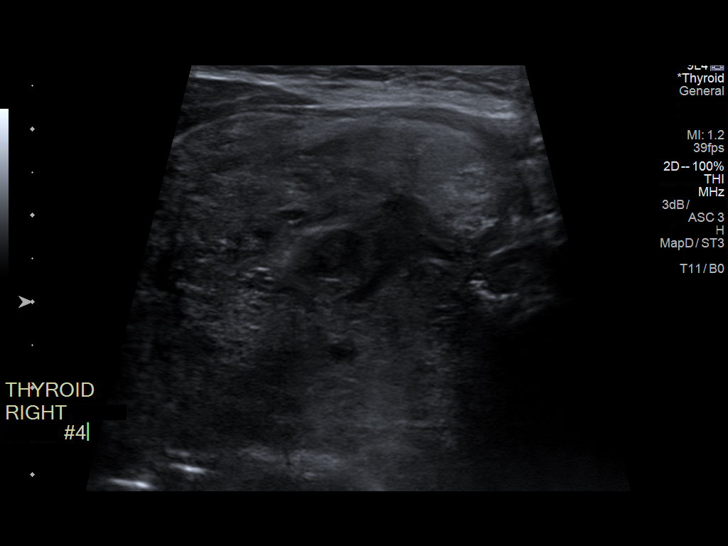
[im 12/16]
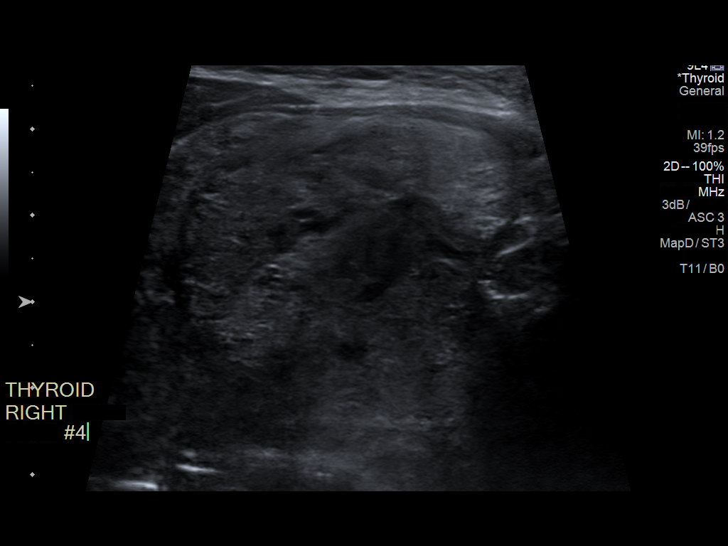
[im 13/16]
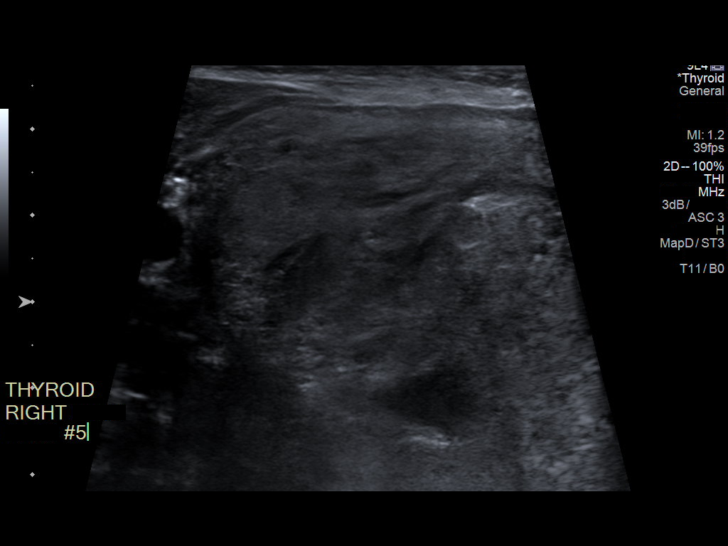
[im 15/16]
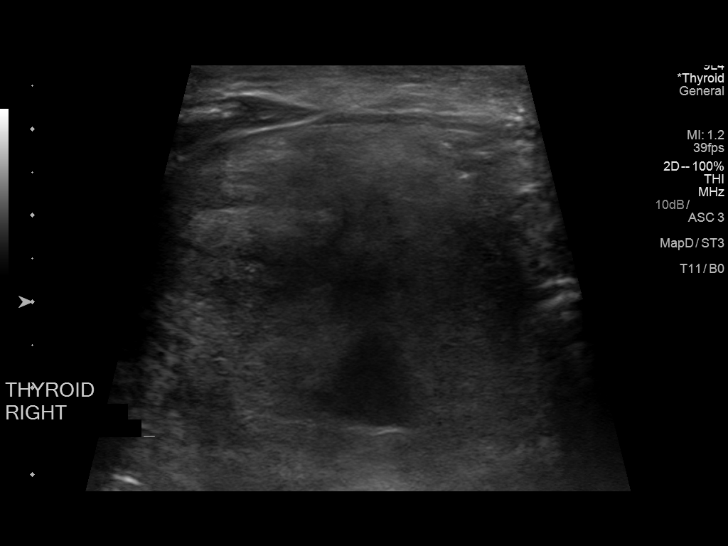
[im 16/16]
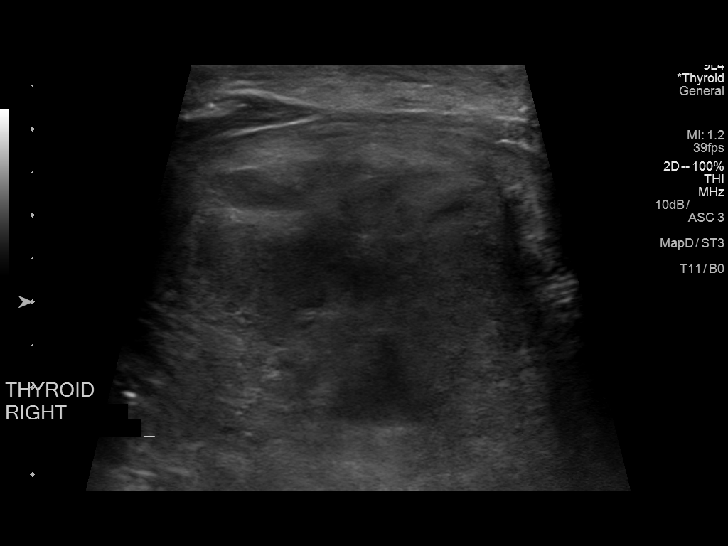

[13 of 16 positions shown; findings below may reference images not displayed]

Pre-procedural ultrasound scanning demonstrated indeterminate
thyroid nodule of the right thyroid unchanged in appearance from
previous exam.

The procedure was planned. The neck was prepped in the usual sterile
fashion, and a sterile drape was applied covering the operative
field. A timeout was performed prior to the initiation of the
procedure. Local anesthesia was provided with 1% lidocaine.

Under direct ultrasound guidance, 5 FNA biopsies were performed of
the right thyroid nodule with a 25 gauge needle. The samples were
prepared and submitted to pathology. Samples were also sent from for
Torchio testing.

Limited post procedural scanning was negative for hematoma or
additional complication. Dressings were placed. The patient
tolerated the above procedures procedure well without immediate
postprocedural complication.
IMPRESSION: Technically successful ultrasound guided fine needle aspiration of
indeterminate right thyroid nodule.

## 2018-04-16 ENCOUNTER — Telehealth: Payer: Self-pay | Admitting: Cardiology

## 2018-04-16 NOTE — Telephone Encounter (Signed)
New Message        Patient is needing a new Rx written for a c-pap machine, patient states they can't repair it any more and she would like to pick the Rx up. Pls call when ready.

## 2018-04-17 NOTE — Telephone Encounter (Signed)
I have never seen this patient

## 2018-04-22 NOTE — Telephone Encounter (Signed)
° °  Patient calling back, states she needs order for new machine. Advised patient she needed to see Dr Mayford Knife. Patient states she saw Dr Mayford Knife when she was with [facility] and has never had a issue getting supplies until now.    1) What problem are you experiencing?  Requesting new order for cpap  machine  2) Who is your medical equipment company? [employer]   Please route to the sleep study assistant.

## 2018-04-23 NOTE — Telephone Encounter (Signed)
Reached out to the patient to advise her that if she has not seen Dr Mayford Knife in three (3) years in this office she is a new patient and she will need to come in for a new patient office visit. Patient states it has been more than three years but she was sure Dr Mayford Knife has been filling her supplies order. I informed her that there were no supporting notes in her chart that say that and Dr Mayford Knife states she has never seen the patient. The patient states she will contact [facility].

## 2018-04-24 ENCOUNTER — Telehealth: Payer: Self-pay | Admitting: Allergy and Immunology

## 2018-04-24 NOTE — Telephone Encounter (Signed)
Called patient and advised that we would need reports from both studies to write for new CPAP and she would need to come in for appt since last was Aug 2018

## 2018-04-24 NOTE — Telephone Encounter (Signed)
Patient has a sleep apnea machine that a cardiologist prescribed for her. That doctor moved and she cannot contact them. She wants to know if Dr. Lucie Leather could write a prescription for one. She said hers had a heater on it that went out, and it can no longer be serviced. She needs a new machine.

## 2019-03-11 ENCOUNTER — Other Ambulatory Visit: Payer: Self-pay

## 2019-03-11 ENCOUNTER — Ambulatory Visit (HOSPITAL_COMMUNITY)
Admission: EM | Admit: 2019-03-11 | Discharge: 2019-03-11 | Disposition: A | Discharge status: Home / Self Care | Payer: BC Managed Care – PPO | Attending: Family Medicine | Admitting: Family Medicine

## 2019-03-11 ENCOUNTER — Encounter (HOSPITAL_COMMUNITY): Payer: Self-pay

## 2019-03-11 DIAGNOSIS — J4521 Mild intermittent asthma with (acute) exacerbation: Secondary | ICD-10-CM | POA: Diagnosis not present

## 2019-03-11 MED ORDER — ALBUTEROL SULFATE HFA 108 (90 BASE) MCG/ACT IN AERS: 2.0000 | INHALATION_SPRAY | RESPIRATORY_TRACT | 0 refills | Status: AC | PRN

## 2019-03-11 MED ORDER — PREDNISONE 20 MG PO TABS: 20.0000 mg | ORAL_TABLET | Freq: Two times a day (BID) | ORAL | 0 refills | Status: DC

## 2019-03-11 NOTE — ED Triage Notes (Signed)
Pt presents with bilateral ear fullness X 2 weeks; pt is also having an asthma flare up with a lot of chest congestion & wheezing.

## 2019-03-11 NOTE — Discharge Instructions (Signed)
Follow-up with your PCP.

## 2019-03-11 NOTE — ED Provider Notes (Signed)
[facility]    CSN: [id number] Arrival date & time: 03/11/19  1511      History   Chief Complaint Chief Complaint  Patient presents with  . Ear Fullness  . Asthma    HPI Nancy Valencia is a 59 y.o. female.   HPI  Patient is here for ear fullness.  She was told by her PCP to come in.  It was noted in addition that she is having wheezing.  Shortness of breath.  She is treating this with as needed albuterol.  She continues to have wheezing that is significant.  Coughing.  Clear sputum.  No fever or chills.  No change in taste or smell.  No exposure to Covid.  This is a typical asthma attack for her.  It has been going on for 2 weeks.  She declines Covid testing  Past Medical History:  Diagnosis Date  . Angio-edema   . Hypothyroid   . Obesity   . Sleep apnea   . Thyroid disease   . Urticaria     Patient Active Problem List   Diagnosis Date Noted  . OBSTRUCTIVE SLEEP APNEA 12/03/2008  . TIA 12/03/2008  . ALLERGIC RHINITIS 12/03/2008  . DYSPNEA 12/03/2008    Past Surgical History:  Procedure Laterality Date  . KNEE SURGERY    . KNEE SURGERY Left 1987 and 1999  . THYROIDECTOMY    . THYROIDECTOMY  2011    OB History   No obstetric history on file.      Home Medications    Prior to Admission medications   Medication Sig Start Date End Date Taking? Authorizing Provider  albuterol (VENTOLIN HFA) 108 (90 Base) MCG/ACT inhaler Inhale 2 puffs into the lungs every 4 (four) hours as needed for wheezing or shortness of breath. 03/11/19   Eustace Moore, MD  aspirin 81 MG chewable tablet Chew by mouth daily.    [provider]  calcium carbonate (OS-CAL) 600 MG TABS tablet Take 600 mg by mouth 2 (two) times daily with a meal.    [provider]  cetirizine (ZYRTEC) 10 MG tablet Take 1 tablet (10 mg total) by mouth 2 (two) times daily. 10/19/16   Kozlow, Alvira Philips, MD  Cholecalciferol (VITAMIN D) 2000 UNITS tablet Take 2,000 Units by mouth  daily.    [provider]  fluticasone (FLONASE) 50 MCG/ACT nasal spray Place 2 sprays into both nostrils daily.    [provider]  Fluticasone Furoate (ARNUITY ELLIPTA) 200 MCG/ACT AEPB Inhale 1 puff into the lungs daily. Patient not taking: Reported on 11/02/2016 06/22/15   Jessica Priest, MD  folic acid (FOLVITE) 1 MG tablet Take 1 mg by mouth daily.    [provider]  Glucosamine-Chondroit-Vit C-Mn (GLUCOSAMINE 1500 COMPLEX PO) Take by mouth.    [provider]  loratadine (CLARITIN) 10 MG tablet Take 1 tablet (10 mg total) by mouth 2 (two) times daily. 11/02/16   Kozlow, Alvira Philips, MD  montelukast (SINGULAIR) 10 MG tablet Take 1 tablet (10 mg total) by mouth at bedtime. 10/19/16   Kozlow, Alvira Philips, MD  Omega-3 Fatty Acids (FISH OIL) 1200 MG CAPS Take by mouth.    [provider]  predniSONE (DELTASONE) 20 MG tablet Take 1 tablet (20 mg total) by mouth 2 (two) times daily with a meal. 03/11/19   Eustace Moore, MD  ranitidine (ZANTAC) 150 MG tablet Take 1 tablet (150 mg total) by mouth 2 (two) times daily. 10/19/16  Kozlow, Alvira PhilipsEric J, MD  Thyroid (NATURE-THROID PO) Take 1 tablet by mouth daily.    [provider]  vitamin C (ASCORBIC ACID) 500 MG tablet Take 500 mg by mouth daily.    [provider]    Family History Family History  Problem Relation Age of Onset  . Lung disease Mother   . Heart disease Father   . Diabetes Brother   . Stroke Maternal Grandmother   . Heart attack Maternal Grandfather   . Liver disease Paternal Grandfather   . Coronary artery disease Father   . Hypertension Father   . Hypertension Brother   . Diabetes Brother     Social History Social History   Tobacco Use  . Smoking status: Never Smoker  . Smokeless tobacco: Never Used  Substance Use Topics  . Alcohol use: No  . Drug use: No     Allergies   Codeine, Pentazocine lactate, and Talwin [pentazocine]   Review of Systems Review of Systems   Constitutional: Negative for chills and fever.  HENT: Positive for ear pain. Negative for congestion and hearing loss.   Eyes: Negative for pain.  Respiratory: Positive for shortness of breath and wheezing. Negative for cough.   Cardiovascular: Negative for chest pain and leg swelling.  Gastrointestinal: Negative for abdominal pain, constipation and diarrhea.  Genitourinary: Negative for dysuria and frequency.  Musculoskeletal: Negative for myalgias.  Neurological: Negative for dizziness, seizures and headaches.  Psychiatric/Behavioral: The patient is not nervous/anxious.      Physical Exam Triage Vital Signs ED Triage Vitals  Enc Vitals Group     BP 03/11/19 1540 (!) 133/93     Pulse Rate 03/11/19 1540 91     Resp 03/11/19 1540 17     Temp 03/11/19 1540 98.1 F (36.7 C)     Temp Source 03/11/19 1540 Oral     SpO2 03/11/19 1540 96 %     Weight --      Height --      Head Circumference --      Peak Flow --      Pain Score 03/11/19 1538 2     Pain Loc --      Pain Edu? --      Excl. in GC? --    No data found.  Updated Vital Signs BP (!) 133/93 (BP Location: Left Arm)   Pulse 91   Temp 98.1 F (36.7 C) (Oral)   Resp 17   SpO2 96%      Physical Exam Constitutional:      General: She is not in acute distress.    Appearance: Normal appearance. She is well-developed.     Comments: Overweight  HENT:     Head: Normocephalic and atraumatic.     Right Ear: Tympanic membrane, ear canal and external ear normal.     Left Ear: Tympanic membrane, ear canal and external ear normal.     Nose: Nose normal.     Mouth/Throat:     Mouth: Mucous membranes are moist.  Eyes:     Conjunctiva/sclera: Conjunctivae normal.     Pupils: Pupils are equal, round, and reactive to light.  Neck:     Comments: Obvious large goiter Cardiovascular:     Rate and Rhythm: Normal rate and regular rhythm.     Heart sounds: Normal heart sounds.  Pulmonary:     Effort: Pulmonary effort is  normal. No respiratory distress.     Breath sounds: Wheezing present.  Comments: Wheezing throughout both lungs Abdominal:     General: There is no distension.     Palpations: Abdomen is soft.  Musculoskeletal:        General: Normal range of motion.     Cervical back: Normal range of motion.  Skin:    General: Skin is warm and dry.  Neurological:     General: No focal deficit present.     Mental Status: She is alert.  Psychiatric:        Mood and Affect: Mood normal.        Behavior: Behavior normal.      UC Treatments / Results  Labs (all labs ordered are listed, but only abnormal results are displayed) Labs Reviewed - No data to display  EKG   Radiology No results found.  Procedures Procedures (including critical care time)  Medications Ordered in UC Medications - No data to display  Initial Impression / Assessment and Plan / UC Course  I have reviewed the triage vital signs and the nursing notes.  Pertinent labs & imaging results that were available during my care of the patient were reviewed by me and considered in my medical decision making (see chart for details).      Final Clinical Impressions(s) / UC Diagnoses   Final diagnoses:  Mild intermittent asthma with exacerbation     Discharge Instructions     Follow up with your PCP   ED Prescriptions    Medication Sig Dispense Auth. Provider   predniSONE (DELTASONE) 20 MG tablet Take 1 tablet (20 mg total) by mouth 2 (two) times daily with a meal. 10 tablet Raylene Everts, MD   albuterol (VENTOLIN HFA) 108 (90 Base) MCG/ACT inhaler Inhale 2 puffs into the lungs every 4 (four) hours as needed for wheezing or shortness of breath. 18 g Raylene Everts, MD     PDMP not reviewed this encounter.   Raylene Everts, MD 03/11/19 2034

## 2019-10-09 DIAGNOSIS — G4733 Obstructive sleep apnea (adult) (pediatric): Secondary | ICD-10-CM | POA: Diagnosis not present

## 2019-11-09 DIAGNOSIS — G4733 Obstructive sleep apnea (adult) (pediatric): Secondary | ICD-10-CM | POA: Diagnosis not present

## 2020-01-01 DIAGNOSIS — G4733 Obstructive sleep apnea (adult) (pediatric): Secondary | ICD-10-CM | POA: Diagnosis not present

## 2020-06-04 DIAGNOSIS — G4733 Obstructive sleep apnea (adult) (pediatric): Secondary | ICD-10-CM | POA: Diagnosis not present

## 2020-06-24 DIAGNOSIS — J4521 Mild intermittent asthma with (acute) exacerbation: Secondary | ICD-10-CM | POA: Diagnosis not present

## 2020-08-11 ENCOUNTER — Other Ambulatory Visit: Payer: Self-pay | Admitting: Family Medicine

## 2020-08-11 ENCOUNTER — Ambulatory Visit
Admission: RE | Admit: 2020-08-11 | Discharge: 2020-08-11 | Disposition: A | Discharge status: Home / Self Care | Payer: BC Managed Care – PPO | Source: Ambulatory Visit | Attending: Family Medicine | Admitting: Family Medicine

## 2020-08-11 ENCOUNTER — Other Ambulatory Visit: Payer: Self-pay

## 2020-08-11 DIAGNOSIS — J45909 Unspecified asthma, uncomplicated: Secondary | ICD-10-CM | POA: Diagnosis not present

## 2020-08-11 DIAGNOSIS — R0602 Shortness of breath: Secondary | ICD-10-CM

## 2020-08-12 ENCOUNTER — Encounter: Payer: Self-pay | Admitting: Internal Medicine

## 2020-09-16 ENCOUNTER — Institutional Professional Consult (permissible substitution): Payer: BC Managed Care – PPO | Admitting: Pulmonary Disease

## 2020-09-23 ENCOUNTER — Other Ambulatory Visit: Payer: Self-pay

## 2020-09-23 ENCOUNTER — Encounter: Payer: Self-pay | Admitting: Internal Medicine

## 2020-09-23 ENCOUNTER — Ambulatory Visit (INDEPENDENT_AMBULATORY_CARE_PROVIDER_SITE_OTHER): Payer: BC Managed Care – PPO | Admitting: Internal Medicine

## 2020-09-23 DIAGNOSIS — J45991 Cough variant asthma: Secondary | ICD-10-CM

## 2020-09-23 HISTORY — DX: Cough variant asthma: J45.991

## 2020-09-23 MED ORDER — PREDNISONE 10 MG PO TABS: ORAL_TABLET | ORAL | 0 refills | Status: DC

## 2020-09-23 MED ORDER — FAMOTIDINE 20 MG PO TABS: ORAL_TABLET | ORAL | 11 refills | Status: AC

## 2020-09-23 MED ORDER — PANTOPRAZOLE SODIUM 40 MG PO TBEC: 40.0000 mg | DELAYED_RELEASE_TABLET | Freq: Every day | ORAL | 2 refills | Status: DC

## 2020-09-23 MED ORDER — BUDESONIDE-FORMOTEROL FUMARATE 80-4.5 MCG/ACT IN AERO
INHALATION_SPRAY | RESPIRATORY_TRACT | 12 refills | Status: DC
Start: 2020-09-23 — End: 2020-12-16

## 2020-09-23 NOTE — Patient Instructions (Addendum)
Stop breo  Plan A = Automatic = Always=    symbicort 80 Take 2 puffs first thing in am and then another 2 puffs about 12 hours later.    Work on inhaler technique:  relax and gently blow all the way out then take a nice smooth full deep breath back in, triggering the inhaler at same time you start breathing in.  Hold for up to 5 seconds if you can. Blow out thru nose. Rinse and gargle with water when done.  If mouth or throat bother you at all,  try brushing teeth/gums/tongue with arm and hammer toothpaste/ make a slurry and gargle and spit out.   Prednisone 10 mg take  4 each am x 2 days,   2 each am x 2 days,  1 each am x 2 days and stop   Pantoprazole (protonix) 40 mg   Take  30-60 min before first meal of the day and Pepcid (famotidine)  20 mg after supper until return to office - this is the best way to tell whether stomach acid is contributing to your problem.      Plan B = Backup (to supplement plan A, not to replace it) Only use your albuterol inhaler as a rescue medication to be used if you can't catch your breath by resting or doing a relaxed purse lip breathing pattern.  - The less you use it, the better it will work when you need it. - Ok to use the inhaler up to 2 puffs  every 4 hours if you must but call for appointment if use goes up over your usual need - Don't leave home without it !!  (think of it like the spare tire for your car)   GERD (REFLUX)  is an extremely common cause of respiratory symptoms just like yours , many times with no obvious heartburn at all.    It can be treated with medication, but also with lifestyle changes including elevation of the head of your bed (ideally with 6 -8inch blocks under the headboard of your bed),  Smoking cessation, avoidance of late meals, excessive alcohol, and avoid fatty foods, chocolate, peppermint, colas, red wine, and acidic juices such as orange juice.  NO MINT OR MENTHOL PRODUCTS SO NO COUGH DROPS  USE SUGARLESS CANDY INSTEAD  (Jolley ranchers or Stover's or Life Savers) or even ice chips will also do - the key is to swallow to prevent all throat clearing. NO OIL BASED VITAMINS - use powdered substitutes.  Avoid fish oil when coughing.    We sill set up up for pfts' in 1-2 weeks if at all possible

## 2020-09-23 NOTE — Assessment & Plan Note (Signed)
Onset of symptom p suspected covid march 2020 - 09/23/2020  After extensive coaching inhaler device,  effectiveness =    90% > true dulera 100 2bid and reflux meds and Prednisone 10 mg take  4 each am x 2 days,   2 each am x 2 days,  1 each am x 2 days and stop and pfts in one week   She may have a mild case of asthma but her main problem on exam is classic upper airway wheeze with assoc goiter so rec  Prednisone 10 mg take  4 each am x 2 days,   2 each am x 2 days,  1 each am x 2 days and stop  Teach mdi / change to symbicort 80 2bid which won't aggravate the upper airway as much as dpi Max gerd rx  Regroup in a couple of weeks with pfts with full f/v loop to quantitate the degree of airflow obst related to the goiter  Discussed in detail all the  indications, usual  risks and alternatives  relative to the benefits with patient who agrees to proceed with w/u as outlined.            Each maintenance medication was reviewed in detail including emphasizing most importantly the difference between maintenance and prns and under what circumstances the prns are to be triggered using an action plan format where appropriate.  Total time for H and P, chart review, counseling, reviewing hfa device(s) and generating customized AVS unique to this new ot office visit / same day charting = 60 min

## 2020-09-23 NOTE — Progress Notes (Signed)
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 Pt states dx with Asthma approx 10 years ago. She c/o increased coughing and wheezing off and on since 2020. She had been using her rescue inhaler 3-4 x per day until started Breo approx 1 month ago- now not using at all.   Dyspnea:  Not limited by breathing from desired activities  / L knee more limiting Cough: dry /immediate at hs and noisy/ better p cpap Sleep: fine with cpap SABA use: not using now   No obvious day to day or daytime variability or assoc excess/ purulent sputum or mucus plugs or hemoptysis or cp or chest tightness, subjective wheeze or overt sinus or hb symptoms.   Sleeping as above  without nocturnal  or early am exacerbation  of respiratory  c/o's or need for noct saba. Also denies any obvious fluctuation of symptoms with weather or environmental changes or other aggravating or alleviating factors except as outlined above   No unusual exposure hx or h/o childhood pna/ asthma or knowledge of premature birth.  Current Allergies, Complete Past Medical History, Past Surgical History, Family History, and Social History were reviewed in [employer] record.  ROS  The following are not active complaints unless bolded Hoarseness, sore throat, dysphagia, dental problems, itching, sneezing,  nasal congestion or discharge of excess mucus or purulent secretions, ear ache,   fever, chills, sweats, unintended wt loss or wt gain, classically pleuritic or exertional cp,  orthopnea pnd or arm/hand swelling  or leg swelling, presyncope, palpitations, abdominal pain, anorexia, nausea, vomiting, diarrhea  or change in bowel habits or change in bladder habits, change in stools or change in urine, dysuria, hematuria,  rash, arthralgias, visual complaints, headache, numbness, weakness or ataxia or problems with walking or coordination,  change in mood or  memory.           Past Medical History:  Diagnosis Date   Angio-edema    Hypothyroid    Obesity    Sleep apnea    Thyroid disease    Urticaria     Outpatient Medications Prior to Visit  Medication Sig Dispense Refill   albuterol (VENTOLIN HFA) 108 (90 Base) MCG/ACT inhaler Inhale 2 puffs into the lungs every 4 (four) hours as needed for wheezing or shortness of breath. 18 g 0   aspirin 81 MG chewable tablet Chew by mouth daily.     calcium carbonate (OS-CAL) 600 MG TABS tablet Take 600 mg by mouth 2 (two) times daily with a meal.     cetirizine (ZYRTEC) 10 MG tablet Take 1 tablet (10 mg total) by mouth 2 (two) times  daily. 60 tablet 5   Cholecalciferol (VITAMIN D) 2000 UNITS tablet Take 2,000 Units by mouth daily.     fluticasone (FLONASE) 50 MCG/ACT nasal spray Place 2 sprays into both nostrils daily.            folic acid (FOLVITE) 1 MG tablet Take 1 mg by mouth daily.     Glucosamine-Chondroit-Vit C-Mn (GLUCOSAMINE 1500 COMPLEX PO) Take by mouth.     loratadine (CLARITIN) 10 MG tablet Take 1 tablet (10 mg total) by mouth 2 (two) times daily. 60 tablet 5   montelukast (SINGULAIR) 10 MG tablet Take 1 tablet (10 mg total) by mouth at bedtime. 30 tablet 5   Omega-3 Fatty  Acids (FISH OIL) 1200 MG CAPS Take by mouth.     predniSONE (DELTASONE) 20 MG tablet Take 1 tablet (20 mg total) by mouth 2 (two) times daily with a meal. 10 tablet 0   ranitidine (ZANTAC) 150 MG tablet Take 1 tablet (150 mg total) by mouth 2 (two) times daily. 60 tablet 5   Thyroid (NATURE-THROID PO) Take 1 tablet by mouth daily.     vitamin C (ASCORBIC ACID) 500 MG tablet Take 500 mg by mouth daily.        Objective:     BP 102/62 (BP Location: Left Arm, Cuff Size: Large)   Pulse 77   Temp 98.6 F (37 C) (Temporal)   Ht 5' 2.5" (1.588 m)   Wt 219 lb 3.2 oz (99.4 kg)   SpO2 95% Comment: on RA  BMI 39.45 kg/m   SpO2: 95 % (on RA)  Amb wf with audible wheeze across the room    HEENT : pt wearing mask not removed for exam due to covid -19 concerns.    NECK :  with obvious R > L goiter and upper airway wheeze    LUNGS: no acc muscle use,  Nl contour chest which is clear to A and P bilaterally without cough on insp or exp maneuvers   CV:  RRR  no s3 or murmur or increase in P2, and no edema   ABD:  soft and nontender with nl inspiratory excursion in the supine position. No bruits or organomegaly appreciated, bowel sounds nl  MS:  Nl gait/ ext warm without deformities, calf tenderness, cyanosis or clubbing No obvious joint restrictions   SKIN: warm and dry without lesions    NEURO:  alert, approp, nl sensorium with  no motor or cerebellar deficits apparent.     I personally reviewed images and agree with radiology impression as follows:  CXR:   08/11/20  No acute abnormalities.  Marked mass effect upon the RIGHT lateral aspect of the trachea by a RIGHT superior mediastinal/inferior cervical mass, patient with known thyroid mass, appears slightly increased in size since previous study; recommend correlation with physical exam and consider follow-up thyroid ultrasound.    Assessment   Cough variant asthma Onset of symptom p suspected covid march 2020 - 09/23/2020   After extensive coaching inhaler device,  effectiveness =    90% > true dulera 100 2bid and reflux meds and Prednisone 10 mg take  4 each am x 2 days,   2 each am x 2 days,  1 each am x 2 days and stop and pfts in one week   She may have a mild case of asthma but her main problem on exam is classic upper airway wheeze with assoc goiter so rec  Prednisone 10 mg take  4 each  am x 2 days,   2 each am x 2 days,  1 each am x 2 days and stop  Teach mdi / change to symbicort 80 2bid which won't aggravate the upper airway as much as dpi Max gerd rx  Regroup in a couple of weeks with pfts with full f/v loop to quantitate the degree of airflow obst related to the goiter  Discussed in detail all the  indications, usual  risks and alternatives  relative to the benefits with patient who agrees to proceed with w/u as outlined.            Each maintenance medication was reviewed in detail including emphasizing most importantly the difference between maintenance and prns and under what circumstances the prns are to be triggered using an action plan format where appropriate.  Total time for H and P, chart review, counseling, reviewing hfa device(s) and generating customized AVS unique to this new ot office visit / same day charting = 60 min           Sandrea Hughs, MD 09/23/2020

## 2020-10-07 ENCOUNTER — Other Ambulatory Visit: Payer: Self-pay | Admitting: Internal Medicine

## 2020-10-07 ENCOUNTER — Other Ambulatory Visit: Payer: Self-pay

## 2020-10-07 ENCOUNTER — Ambulatory Visit: Payer: BC Managed Care – PPO

## 2020-10-07 DIAGNOSIS — R0602 Shortness of breath: Secondary | ICD-10-CM

## 2020-10-07 LAB — PULMONARY FUNCTION TEST
DL/VA % pred: 119 %
DL/VA: 5.14 ml/min/mmHg/L
DLCO cor % pred: 146 %
DLCO cor: 27.83 ml/min/mmHg
DLCO unc % pred: 146 %
DLCO unc: 27.83 ml/min/mmHg
FEF 25-75 Post: 2.26 L/sec
FEF 25-75 Pre: 1.82 L/sec
FEF2575-%Change-Post: 23 %
FEF2575-%Pred-Post: 97 %
FEF2575-%Pred-Pre: 78 %
FEV1-%Change-Post: 8 %
FEV1-%Pred-Post: 86 %
FEV1-%Pred-Pre: 79 %
FEV1-Post: 2.1 L
FEV1-Pre: 1.94 L
FEV1FVC-%Change-Post: 0 %
FEV1FVC-%Pred-Pre: 99 %
FEV6-%Change-Post: 7 %
FEV6-%Pred-Post: 88 %
FEV6-%Pred-Pre: 82 %
FEV6-Post: 2.66 L
FEV6-Pre: 2.47 L
FEV6FVC-%Pred-Post: 103 %
FEV6FVC-%Pred-Pre: 103 %
FVC-%Change-Post: 7 %
FVC-%Pred-Post: 85 %
FVC-%Pred-Pre: 79 %
FVC-Post: 2.66 L
FVC-Pre: 2.47 L
Post FEV1/FVC ratio: 79 %
Post FEV6/FVC ratio: 100 %
Pre FEV1/FVC ratio: 78 %
Pre FEV6/FVC Ratio: 100 %
RV % pred: 96 %
RV: 1.78 L
TLC % pred: 103 %
TLC: 4.92 L

## 2020-10-12 ENCOUNTER — Telehealth: Payer: Self-pay | Admitting: Internal Medicine

## 2020-10-12 NOTE — Telephone Encounter (Signed)
Called and spoke with patient to go over PFT results per Dr. Sherene Sires. She expressed understanding. Advised her to call if anything changes. Nothing further needed at this time.    Nancy Cowden, MD  10/10/2020  6:11 AM EDT      Call patient :  Study shows no evidence of airflow obst p symbicort that am so whatever asthma is present has been eliminated, by symbicort but if still having symptoms needs re group in office next available as additional asthma rx(other than symbicort) at this point is not needed

## 2020-10-12 NOTE — Progress Notes (Signed)
Tried calling the pt and there was no answer- LMTCB.  

## 2020-12-16 ENCOUNTER — Telehealth: Payer: Self-pay | Admitting: Internal Medicine

## 2020-12-16 MED ORDER — BUDESONIDE-FORMOTEROL FUMARATE 80-4.5 MCG/ACT IN AERO: INHALATION_SPRAY | RESPIRATORY_TRACT | 3 refills | Status: DC

## 2020-12-16 MED ORDER — PREDNISONE 10 MG PO TABS: ORAL_TABLET | ORAL | 0 refills | Status: DC

## 2020-12-16 NOTE — Telephone Encounter (Signed)
ATC, left VM. I sent in refill for Symbicort already.

## 2020-12-16 NOTE — Telephone Encounter (Signed)
Prednisone 10 mg take  4 each am x 2 days,   2 each am x 2 days,  1 each am x 2 days and stop   Needs f/u ov next avail with all meds/ inhalers in hand as maint rx is not adequate

## 2020-12-16 NOTE — Telephone Encounter (Signed)
Patient is returning phone call. Patient phone number is [phone].

## 2020-12-16 NOTE — Telephone Encounter (Signed)
I called and spoke with patient rearding Dr. Jules Schick. Patient verbalized understanding, I sent in pred taper to preferred pharmacy and scheduled an appt with MW on 12/20/20 and told to bring all meds to visit. Nothing further needed.

## 2020-12-16 NOTE — Telephone Encounter (Signed)
Called and spoke with Patient.  Patient stated she is having a allergy flare.  Patient stated she has been taking Symbicort as directed, Mucinex, and Xyzal.  Patient stated she has a productive cough, clear sputum, and some wheezing.  Patient denies any fever or chills.  Patient stated she needs recommendations for what to do when she has flare up's like this.  Message routed to Dr. Sherene Sires to advise  LOV 09/23/20-Dr. Sherene Sires  Instructions  Stop breo   Plan A = Automatic = Always=    symbicort 80 Take 2 puffs first thing in am and then another 2 puffs about 12 hours later.    Work on inhaler technique:  relax and gently blow all the way out then take a nice smooth full deep breath back in, triggering the inhaler at same time you start breathing in.  Hold for up to 5 seconds if you can. Blow out thru nose. Rinse and gargle with water when done.  If mouth or throat bother you at all,  try brushing teeth/gums/tongue with arm and hammer toothpaste/ make a slurry and gargle and spit out.    Prednisone 10 mg take  4 each am x 2 days,   2 each am x 2 days,  1 each am x 2 days and stop    Pantoprazole (protonix) 40 mg   Take  30-60 min before first meal of the day and Pepcid (famotidine)  20 mg after supper until return to office - this is the best way to tell whether stomach acid is contributing to your problem.       Plan B = Backup (to supplement plan A, not to replace it) Only use your albuterol inhaler as a rescue medication to be used if you can't catch your breath by resting or doing a relaxed purse lip breathing pattern.  - The less you use it, the better it will work when you need it. - Ok to use the inhaler up to 2 puffs  every 4 hours if you must but call for appointment if use goes up over your usual need - Don't leave home without it !!  (think of it like the spare tire for your car)    GERD (REFLUX)  is an extremely common cause of respiratory symptoms just like yours , many times with no  obvious heartburn at all.     It can be treated with medication, but also with lifestyle changes including elevation of the head of your bed (ideally with 6 -8inch blocks under the headboard of your bed),  Smoking cessation, avoidance of late meals, excessive alcohol, and avoid fatty foods, chocolate, peppermint, colas, red wine, and acidic juices such as orange juice.  NO MINT OR MENTHOL PRODUCTS SO NO COUGH DROPS  USE SUGARLESS CANDY INSTEAD (Jolley ranchers or Stover's or Life Savers) or even ice chips will also do - the key is to swallow to prevent all throat clearing. NO OIL BASED VITAMINS - use powdered substitutes.  Avoid fish oil when coughing.      We sill set up up for pfts' in 1-2 weeks if at all possible

## 2020-12-20 ENCOUNTER — Encounter: Payer: Self-pay | Admitting: Internal Medicine

## 2020-12-20 ENCOUNTER — Ambulatory Visit (INDEPENDENT_AMBULATORY_CARE_PROVIDER_SITE_OTHER): Payer: BC Managed Care – PPO | Admitting: Internal Medicine

## 2020-12-20 ENCOUNTER — Other Ambulatory Visit: Payer: Self-pay

## 2020-12-20 DIAGNOSIS — J45991 Cough variant asthma: Secondary | ICD-10-CM

## 2020-12-20 DIAGNOSIS — R058 Other specified cough: Secondary | ICD-10-CM | POA: Diagnosis not present

## 2020-12-20 MED ORDER — PREDNISONE 10 MG PO TABS: ORAL_TABLET | ORAL | 4 refills | Status: DC

## 2020-12-20 NOTE — Progress Notes (Signed)
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 Pt states dx with Asthma approx 10 years ago. She c/o increased coughing and wheezing off and on since 2020. She had been using her rescue inhaler 3-4 x per day until started Breo approx 1 month ago- now not using at all.   Dyspnea:  Not limited by breathing from desired activities  / L knee more limiting Cough: dry /immediate at hs and noisy/ better p cpap Sleep: fine with cpap SABA use: not using now Rec Stop breo Plan A = Automatic = Always=    symbicort 80 Take 2 puffs first thing in am and then another 2 puffs about 12 hours later.   Work on inhaler technique:  Prednisone 10 mg take  4 each am x 2 days,   2 each am x 2 days,  1 each am x 2 days and stop  Pantoprazole (protonix) 40 mg   Take  30-60 min before first meal of the day and Pepcid (famotidine)  20 mg after supper until return to office - this is the best way to tell whether stomach acid is contributing to your problem.     Plan B = Backup (to supplement plan A, not to replace it) Only use your albuterol inhaler as a rescue medication GERD diet reviewed, bed blocks rec    12/20/2020  f/u  ov/Bralin Garry re: uacs/ TM    maint on symbicort 80 2bid   Chief Complaint  Patient presents with   Follow-up    Increased cough for the past week- prod with yellow sputum. She states she had been cough free prior to this for approx 3 wks. Currently on pred taper- 2 days remaining. She is using her albuterol inhaler one per day recently.   Dyspnea:  variable and seems better p prednisone each time Cough: immediately when hs/ better with cpap Sleeping: [name] cpap sleeping fine  SABA use: no more  than once a day  02: none  Covid status:   may have had covid, only vax x 1    No obvious day to day or daytime variability or assoc excess/ purulent sputum or mucus plugs or hemoptysis or cp or chest tightness, subjective wheeze or overt sinus or hb symptoms.   Sleeping  without nocturnal  or early am exacerbation  of respiratory  c/o's or need for noct saba. Also denies any obvious fluctuation of symptoms with weather or environmental changes or other aggravating or alleviating factors except as outlined above   No unusual exposure hx or h/o childhood pna/ asthma or knowledge of premature birth.  Current Allergies, Complete Past Medical History,  Past Surgical History, Family History, and Social History were reviewed in [employer] record.  ROS  The following are not active complaints unless bolded Hoarseness, sore throat, dysphagia, dental problems, itching, sneezing,  nasal congestion or discharge of excess mucus or purulent secretions, ear ache,   fever, chills, sweats, unintended wt loss or wt gain, classically pleuritic or exertional cp,  orthopnea pnd or arm/hand swelling  or leg swelling, presyncope, palpitations, abdominal pain, anorexia, nausea, vomiting, diarrhea  or change in bowel habits or change in bladder habits, change in stools or change in urine, dysuria, hematuria,  rash, arthralgias, visual complaints, headache, numbness, weakness or ataxia or problems with  walking or coordination,  change in mood or  memory.        Current Meds  Medication Sig   albuterol (VENTOLIN HFA) 108 (90 Base) MCG/ACT inhaler Inhale 2 puffs into the lungs every 4 (four) hours as needed for wheezing or shortness of breath.   aspirin 81 MG chewable tablet Chew by mouth daily.   budesonide-formoterol (SYMBICORT) 80-4.5 MCG/ACT inhaler Take 2 puffs first thing in am and then another 2 puffs about 12 hours later.   calcium carbonate (OS-CAL) 600 MG TABS tablet Take 600 mg by mouth 2 (two) times daily with a meal.   Cholecalciferol (VITAMIN D) 2000 UNITS tablet Take 2,000 Units by mouth daily.   famotidine (PEPCID) 20 MG tablet One after supper   fluticasone (FLONASE) 50 MCG/ACT nasal spray Place 2 sprays into both nostrils daily.   folic acid (FOLVITE) 1 MG tablet Take 1 mg by mouth daily.   Glucosamine-Chondroit-Vit C-Mn (GLUCOSAMINE 1500 COMPLEX PO) Take by mouth.   levocetirizine (XYZAL) 5 MG tablet Take 5 mg by mouth every evening.   pantoprazole (PROTONIX) 40 MG tablet Take 1 tablet (40 mg total) by mouth daily. Take 30-60 min before first meal of the day   predniSONE (DELTASONE) 10 MG tablet Take 4 tabs each am  for 2 days, 2 tabs each am for 2 days, 1 tab each am for 2 days and stop   Thyroid (NATURE-THROID PO) Take 1 tablet by mouth daily.   vitamin C (ASCORBIC ACID) 500 MG tablet Take 500 mg by mouth daily.              Past Medical History:  Diagnosis Date   Angio-edema    Hypothyroid    Obesity    Sleep apnea    Thyroid disease    Urticaria        Objective:     Wt Readings from Last 3 Encounters:  12/20/20 219 lb (99.3 kg)  09/23/20 219 lb 3.2 oz (99.4 kg)  10/19/16 212 lb 9.6 oz (96.4 kg)      Vital signs reviewed  12/20/2020  - Note at rest 02 sats  95% on RA   General appearance: ambulatory wf with raspy insp noises heard across the room, worse with FIVC    HEENT : pt wearing mask not removed for exam due to covid -19 concerns.     NECK :   classic large R > L goiter    LUNGS: no acc muscle use,  Nl contour chest which is clear to A and P bilaterally without cough on insp or exp maneuvers   CV:  RRR  no s3 or murmur or increase in P2, and no edema   ABD:  soft and nontender with nl inspiratory excursion in the supine position. No bruits or organomegaly appreciated, bowel  sounds nl  MS:  Nl gait/ ext warm without deformities, calf tenderness, cyanosis or clubbing No obvious joint restrictions   SKIN: warm and dry without lesions    NEURO:  alert, approp, nl sensorium with  no motor or cerebellar deficits apparent.        Assessment

## 2020-12-20 NOTE — Patient Instructions (Signed)
If get worse > Prednisone 10 mg take  4 each am x 2 days,   2 each am x 2 days,  1 each am x 2 days and stop and refills if needed  Plan A = Automatic = Always=    symbicort 80 Take 2 puffs first thing in am and then another 2 puffs about 12 hours later.     Plan B = Backup (to supplement plan A, not to replace it) Only use your albuterol inhaler as a rescue medication to be used if you can't catch your breath by resting or doing a relaxed purse lip breathing pattern.  - The less you use it, the better it will work when you need it. - Ok to use the inhaler up to 2 puffs  every 4 hours if you must but call for appointment if use goes up over your usual need - Don't leave home without it !!  (think of it like the spare tire for your car)      I will be referring you to [facility]/ ENT / voice center to see Dr Harriette Ohara   Call in meantime if needed

## 2020-12-21 ENCOUNTER — Encounter: Payer: Self-pay | Admitting: Internal Medicine

## 2020-12-21 DIAGNOSIS — E042 Nontoxic multinodular goiter: Secondary | ICD-10-CM | POA: Diagnosis not present

## 2020-12-21 DIAGNOSIS — E039 Hypothyroidism, unspecified: Secondary | ICD-10-CM | POA: Diagnosis not present

## 2020-12-21 NOTE — Assessment & Plan Note (Addendum)
Onset of symptom p suspected covid march 2020 - 09/23/2020  After extensive coaching inhaler device,  effectiveness =    90% > true dulera 100 2bid and reflux meds and Prednisone 10 mg take  4 each am x 2 days,   2 each am x 2 days,  1 each am x 2 days and stop and pfts in one week  - PFT's  10/07/20 FEV1 2.10 (86 % ) ratio 0.79  p 8 % improvement from saba p symbicort prior to study with DLCO  27.83 (146%) corrects to 5.18 (118%)  for alv volume and FV curve slt flattening on insp and ERV = 35% @ wt 216    Asthma component is well controlled despite reports "feels better with prednisone " and she clearly needs a 2nd opinion about how to manage her upper airway obst > refer to [facility].   Given prednisone x 6 days prn in meantime for flares  NB compensation for exp flow from asthma is to raise insp flows which in her case is limited by the TM / tracheal obst above the Thoracic inlet so a little asthma goes a long way here.

## 2020-12-21 NOTE — Assessment & Plan Note (Addendum)
Assoc with classic goiter and insp plateau on PFTs  10/07/20  - referred to [facility] ENT division 12/21/2020   She is seeing her [facility] endocrinologist next week and unless there is clear path to improvement with medical rx I would advise going ahead with surgical consultation.  Discussed in detail all the  indications, usual  risks and alternatives  relative to the benefits with patient who agrees to proceed with w/u as outlined.             Each maintenance medication was reviewed in detail including emphasizing most importantly the difference between maintenance and prns and under what circumstances the prns are to be triggered using an action plan format where appropriate.  Total time for H and P, chart review, counseling, reviewing hfa device(s) and generating customized AVS unique to this office visit / same day charting = 38 min

## 2020-12-22 ENCOUNTER — Telehealth: Payer: Self-pay | Admitting: Internal Medicine

## 2020-12-22 MED ORDER — AMOXICILLIN-POT CLAVULANATE 875-125 MG PO TABS: 1.0000 | ORAL_TABLET | Freq: Two times a day (BID) | ORAL | 0 refills | Status: DC

## 2020-12-22 NOTE — Telephone Encounter (Signed)
Called and spoke with patient. She stated that she was seen by MW on 12/20/20 and was prescribed a prednisone taper (4x2, 2x2. 1x2) on 09/29. She has been wheezing and coughing up clear phlegm. She had been blowing out clear mucus out of her nose until this morning. The mucus has now turned green. She will take the last of her prednisone taper today but she does have the prednisone taper MW sent for emergencies on 12/20/20.  She wanted to know if she needed to have an abx and start over on the prednisone taper.   Pharmacy is [facility] in [place].   MW, can you please advise? Thanks!

## 2020-12-22 NOTE — Telephone Encounter (Signed)
Yes start over the taper plus  Augmentin 875 mg take one pill twice daily  X 10 days - take at breakfast and supper with large glass of water.  It would help reduce the usual side effects (diarrhea and yeast infections) if you ate cultured yogurt at lunch.

## 2020-12-22 NOTE — Telephone Encounter (Signed)
Called and spoke with patient. She verbalized understanding of MW's recs. RX for Augmentin has been [facility] to [facility] [place].   Nothing further needed at time of call.

## 2021-01-03 ENCOUNTER — Other Ambulatory Visit: Payer: Self-pay | Admitting: Internal Medicine

## 2021-02-07 DIAGNOSIS — R059 Cough, unspecified: Secondary | ICD-10-CM | POA: Diagnosis not present

## 2021-02-07 DIAGNOSIS — J387 Other diseases of larynx: Secondary | ICD-10-CM | POA: Diagnosis not present

## 2021-02-07 DIAGNOSIS — R053 Chronic cough: Secondary | ICD-10-CM | POA: Diagnosis not present

## 2021-02-07 DIAGNOSIS — J383 Other diseases of vocal cords: Secondary | ICD-10-CM | POA: Diagnosis not present

## 2021-02-07 DIAGNOSIS — R1310 Dysphagia, unspecified: Secondary | ICD-10-CM | POA: Diagnosis not present

## 2021-02-07 DIAGNOSIS — J398 Other specified diseases of upper respiratory tract: Secondary | ICD-10-CM | POA: Diagnosis not present

## 2021-02-07 DIAGNOSIS — R490 Dysphonia: Secondary | ICD-10-CM | POA: Diagnosis not present

## 2021-02-07 DIAGNOSIS — J384 Edema of larynx: Secondary | ICD-10-CM | POA: Diagnosis not present

## 2021-02-07 DIAGNOSIS — E0789 Other specified disorders of thyroid: Secondary | ICD-10-CM | POA: Diagnosis not present

## 2021-02-23 DIAGNOSIS — E042 Nontoxic multinodular goiter: Secondary | ICD-10-CM | POA: Diagnosis not present

## 2021-02-23 DIAGNOSIS — J398 Other specified diseases of upper respiratory tract: Secondary | ICD-10-CM | POA: Diagnosis not present

## 2021-02-23 DIAGNOSIS — E079 Disorder of thyroid, unspecified: Secondary | ICD-10-CM | POA: Diagnosis not present

## 2021-03-05 DIAGNOSIS — U071 COVID-19: Secondary | ICD-10-CM | POA: Diagnosis not present

## 2021-04-18 DIAGNOSIS — E049 Nontoxic goiter, unspecified: Secondary | ICD-10-CM | POA: Diagnosis not present

## 2021-04-18 DIAGNOSIS — E079 Disorder of thyroid, unspecified: Secondary | ICD-10-CM | POA: Diagnosis not present

## 2021-04-18 DIAGNOSIS — Z888 Allergy status to other drugs, medicaments and biological substances status: Secondary | ICD-10-CM | POA: Diagnosis not present

## 2021-04-18 DIAGNOSIS — J398 Other specified diseases of upper respiratory tract: Secondary | ICD-10-CM | POA: Diagnosis not present

## 2021-04-18 DIAGNOSIS — G4730 Sleep apnea, unspecified: Secondary | ICD-10-CM | POA: Diagnosis not present

## 2021-04-18 DIAGNOSIS — Z885 Allergy status to narcotic agent status: Secondary | ICD-10-CM | POA: Diagnosis not present

## 2021-04-18 DIAGNOSIS — E042 Nontoxic multinodular goiter: Secondary | ICD-10-CM | POA: Diagnosis not present

## 2021-04-19 DIAGNOSIS — Z888 Allergy status to other drugs, medicaments and biological substances status: Secondary | ICD-10-CM | POA: Diagnosis not present

## 2021-04-19 DIAGNOSIS — Z885 Allergy status to narcotic agent status: Secondary | ICD-10-CM | POA: Diagnosis not present

## 2021-04-19 DIAGNOSIS — J398 Other specified diseases of upper respiratory tract: Secondary | ICD-10-CM | POA: Diagnosis not present

## 2021-04-19 DIAGNOSIS — E079 Disorder of thyroid, unspecified: Secondary | ICD-10-CM | POA: Diagnosis not present

## 2021-04-19 DIAGNOSIS — E049 Nontoxic goiter, unspecified: Secondary | ICD-10-CM | POA: Diagnosis not present

## 2021-04-20 DIAGNOSIS — J398 Other specified diseases of upper respiratory tract: Secondary | ICD-10-CM | POA: Diagnosis not present

## 2021-04-20 DIAGNOSIS — E079 Disorder of thyroid, unspecified: Secondary | ICD-10-CM | POA: Diagnosis not present

## 2021-04-20 DIAGNOSIS — E049 Nontoxic goiter, unspecified: Secondary | ICD-10-CM | POA: Diagnosis not present

## 2021-04-20 DIAGNOSIS — Z885 Allergy status to narcotic agent status: Secondary | ICD-10-CM | POA: Diagnosis not present

## 2021-04-20 DIAGNOSIS — Z888 Allergy status to other drugs, medicaments and biological substances status: Secondary | ICD-10-CM | POA: Diagnosis not present

## 2021-05-25 DIAGNOSIS — E890 Postprocedural hypothyroidism: Secondary | ICD-10-CM | POA: Diagnosis not present

## 2021-05-25 DIAGNOSIS — E039 Hypothyroidism, unspecified: Secondary | ICD-10-CM | POA: Diagnosis not present

## 2021-05-25 DIAGNOSIS — Z833 Family history of diabetes mellitus: Secondary | ICD-10-CM | POA: Diagnosis not present

## 2021-05-30 DIAGNOSIS — E039 Hypothyroidism, unspecified: Secondary | ICD-10-CM | POA: Diagnosis not present

## 2021-05-31 DIAGNOSIS — G4733 Obstructive sleep apnea (adult) (pediatric): Secondary | ICD-10-CM | POA: Diagnosis not present

## 2021-07-14 DIAGNOSIS — E039 Hypothyroidism, unspecified: Secondary | ICD-10-CM | POA: Diagnosis not present

## 2021-09-01 ENCOUNTER — Telehealth: Payer: Self-pay | Admitting: Internal Medicine

## 2021-09-01 DIAGNOSIS — J4520 Mild intermittent asthma, uncomplicated: Secondary | ICD-10-CM | POA: Diagnosis not present

## 2021-09-02 NOTE — Telephone Encounter (Signed)
Called and spoke with patient who states that Symbicort is not covered by her insurance since she switch plans. She states that it was covered before she switched plans. She states she still has [employer] but changed plans due to her husband retiring. Advised her to call her insurance and ask them what is on the formulary that is covered and to ask them if there is a letter or tier exception or something that we can do to get Symbicort covered for her. She expressed understanding. Will wait to hear from patient.

## 2021-09-14 NOTE — Telephone Encounter (Signed)
Called patient but she did not answer. Left message for her to call us back.  

## 2021-09-19 DIAGNOSIS — E039 Hypothyroidism, unspecified: Secondary | ICD-10-CM | POA: Diagnosis not present

## 2021-09-19 NOTE — Telephone Encounter (Signed)
Called and spoke with patient to let her know the recs from Dr. Sherene Sires. She said she would like RX sent to pharmacy. I have called that in to preferred pharmacy. Advised her to bring all medications with her to her upcoming appt. She expressed understanding. Nothing further needed at this time.

## 2021-09-19 NOTE — Telephone Encounter (Signed)
In the short run can use phenergan dm 1-2 tsp qid prn x 250 cc and be sure to bring all active meds or ov next week to regroup

## 2021-09-19 NOTE — Telephone Encounter (Signed)
Called and spoke with pt who states that she is coughing a lot after she eats. States she is taking the pantoprazole as directed as well as the pepcid at night.  Pt said that throughout the day she is okay but when it comes time for supper is when the cough is so out of control to the point that she will vomit from coughing so much and also states that she will also begin to wheeze.  Pt wants to know if there is anything that could be recommended to help with this if there might be something else that she could take. Dr. Sherene Sires please advise.

## 2021-09-27 ENCOUNTER — Encounter: Payer: Self-pay | Admitting: Internal Medicine

## 2021-09-27 ENCOUNTER — Ambulatory Visit: Payer: BC Managed Care – PPO | Admitting: Internal Medicine

## 2021-09-27 VITALS — BP 122/82 | HR 88 | Temp 98.5°F | Ht 62.5 in | Wt 207.6 lb

## 2021-09-27 DIAGNOSIS — R058 Other specified cough: Secondary | ICD-10-CM

## 2021-09-27 DIAGNOSIS — J45991 Cough variant asthma: Secondary | ICD-10-CM | POA: Diagnosis not present

## 2021-09-27 LAB — NITRIC OXIDE: Nitric Oxide: 128

## 2021-09-27 MED ORDER — PREDNISONE 10 MG PO TABS: ORAL_TABLET | ORAL | 0 refills | Status: AC

## 2021-09-27 MED ORDER — BUDESONIDE-FORMOTEROL FUMARATE 80-4.5 MCG/ACT IN AERO: INHALATION_SPRAY | RESPIRATORY_TRACT | 12 refills | Status: AC

## 2021-09-27 MED ORDER — PANTOPRAZOLE SODIUM 40 MG PO TBEC: 40.0000 mg | DELAYED_RELEASE_TABLET | Freq: Every day | ORAL | 3 refills | Status: AC

## 2021-09-27 MED ORDER — GABAPENTIN 100 MG PO CAPS: 100.0000 mg | ORAL_CAPSULE | Freq: Three times a day (TID) | ORAL | 2 refills | Status: AC

## 2021-09-27 NOTE — Patient Instructions (Signed)
Symbicort 80 Take 2 puffs first thing in am and then another 2 puffs about 12 hours later.    Only use your albuterol as a rescue medication to be used if you can't catch your breath by resting or doing a relaxed purse lip breathing pattern.  - The less you use it, the better it will work when you need it. - Ok to use up to 2 puffs  every 4 hours if you must but call for immediate appointment if use goes up over your usual need - Don't leave home without it !!  (think of it like the spare tire for your car)   Work on inhaler technique:  relax and gently blow all the way out then take a nice smooth full deep breath back in, triggering the inhaler at same time you start breathing in.  Hold for up to 5 seconds if you can. Blow  symbicort out thru nose. Rinse and gargle with water when done.  If mouth or throat bother you at all,  try brushing teeth/gums/tongue with arm and hammer toothpaste/ make a slurry and gargle and spit out.   Gabapentin 100 mg three times ok to adjust up or down to find a dose that controls the cough   Prednisone 10 mg take  4 each am x 2 days,   2 each am x 2 days,  1 each am x 2 days and stop   Please schedule a follow up office visit in 6 weeks, call sooner if needed

## 2021-09-27 NOTE — Progress Notes (Unsigned)
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 Pt states dx with Asthma approx 10 years ago. She c/o increased coughing and wheezing off and on since 2020. She had been using her rescue inhaler 3-4 x per day until started Breo approx 1 month ago- now not using at all.   Dyspnea:  Not limited by breathing from desired activities  / L knee more limiting Cough: dry /immediate at hs and noisy/ better p cpap Sleep: fine with cpap SABA use: not using now Rec Stop breo Plan A = Automatic = Always=    symbicort 80 Take 2 puffs first thing in am and then another 2 puffs about 12 hours later.   Work on inhaler technique:  Prednisone 10 mg take  4 each am x 2 days,   2 each am x 2 days,  1 each am x 2 days and stop  Pantoprazole (protonix) 40 mg   Take  30-60 min before first meal of the day and Pepcid (famotidine)  20 mg after supper until return to office - this is the best way to tell whether stomach acid is contributing to your problem.     Plan B = Backup (to supplement plan A, not to replace it) Only use your albuterol inhaler as a rescue medication GERD diet reviewed, bed blocks rec    12/20/2020  f/u  ov/Nancy Valencia re: uacs/ TM    maint on symbicort 80 2bid   Chief Complaint  Patient presents with   Follow-up    Increased cough for the past week- prod with yellow sputum. She states she had been cough free prior to this for approx 3 wks. Currently on pred taper- 2 days remaining. She is using her albuterol inhaler one per day recently.   Dyspnea:  variable and seems better p prednisone each time Cough: immediately when hs/ better with cpap Sleeping: [name] cpap sleeping fine  SABA use: no more  than once a day  02: none  Covid status:   may have had covid, only vax x 1  Rec If get worse > Prednisone 10 mg take  4 each am x 2 days,   2 each am x 2 days,  1 each am x 2 days and stop and refills if needed Plan A = Automatic = Always=    symbicort 80 Take 2 puffs first thing in am and then another 2 puffs about 12 hours later.   Plan B = Backup (to supplement plan A, not to replace it) Only use your albuterol inhaler as a rescue medication   I will be referring you to [facility]/ ENT / voice  center to see Nancy Valencia >>> thyroidectomy  Apr 18 2021   09/27/2021  f/u ov/Nancy Valencia re: uacs s/p thyroidectomy    maint on protonix ac / pepcid in bid due to mid afternoon  overt HB with freq throat clearing  Chief Complaint  Patient presents with   Follow-up    Pt states she is still coughing so hard her stomach hurts and it makes her have an asthma attack and not able eat.  Last prednisone dose 2 weeks prior to OV  per Nancy Nancy Valencia and has been off symbicort for a week - was clearly better on prednisone/ worse off it despite symbicort 80 but poor hfa technique noted - see a/p  Dyspnea:  as long as not coughing, no doe  Cough: no production now / worse w/in 15 of  supper   Sleeping: fine on left side/ cpap  SABA use: none today 02: none  Covid status:   vax x 2    No obvious other patterns in  day to day or daytime variability or assoc excess/ purulent sputum or mucus plugs or hemoptysis or cp or  chest tightness, subjective wheeze or overt sinus  symptoms.   Sleeping as above without nocturnal  or early am exacerbation  of respiratory  c/o's or need for noct saba. Also denies any obvious fluctuation of symptoms with weather or environmental changes or other aggravating or alleviating factors except as outlined above   No unusual exposure hx or h/o childhood pna/ asthma or knowledge of premature birth.  Current Allergies, Complete Past Medical History, Past Surgical History, Family History, and Social History were reviewed in [employer] record.  ROS  The following are not active complaints unless bolded Hoarseness, sore throat, dysphagia, dental problems, itching, sneezing,  nasal congestion or discharge of excess mucus or purulent secretions, ear ache,   fever, chills, sweats, unintended wt loss or wt gain, classically pleuritic or exertional cp,  orthopnea pnd or arm/hand swelling  or leg swelling, presyncope, palpitations, abdominal pain, anorexia, nausea, vomiting, diarrhea  or change in bowel habits or change in bladder habits, change in stools or change in urine, dysuria, hematuria,  rash, arthralgias, visual complaints, headache, numbness, weakness or ataxia or problems with walking or coordination,  change in mood or  memory.        Current Meds  Medication Sig   albuterol (VENTOLIN HFA) 108 (90 Base) MCG/ACT inhaler Inhale 2 puffs into the lungs every 4 (four) hours as needed for wheezing or shortness of breath.   aspirin 81 MG chewable tablet Chew by mouth daily.   calcium carbonate (OS-CAL) 600 MG TABS tablet Take 600 mg by mouth 2 (two) times daily with a meal.   Cholecalciferol (VITAMIN D) 2000 UNITS tablet Take 2,000 Units by mouth daily.   famotidine (PEPCID) 20 MG tablet One after supper   folic acid (FOLVITE) 1 MG tablet Take 1 mg by mouth daily.   Glucosamine-Chondroit-Vit C-Mn (GLUCOSAMINE 1500 COMPLEX PO) Take by mouth.   levocetirizine (XYZAL)  5 MG tablet Take 5 mg by mouth every evening.   pantoprazole (PROTONIX) 40 MG tablet TAKE 1 TABLET (40 MG TOTAL) BY MOUTH DAILY. TAKE 30-60 MIN BEFORE FIRST MEAL OF THE DAY   Thyroid (NATURE-THROID PO) Take 1 tablet by mouth daily.   vitamin C (ASCORBIC ACID) 500 MG tablet Take 500 mg by mouth daily.               Past Medical  History:  Diagnosis Date   Angio-edema    Hypothyroid    Obesity    Sleep apnea    Thyroid disease    Urticaria        Objective:    Wts   09/27/2021      207    12/20/20 219 lb (99.3 kg)  09/23/20 219 lb 3.2 oz (99.4 kg)  10/19/16 212 lb 9.6 oz (96.4 kg)    Vital signs reviewed  09/27/2021  - Note at rest 02 sats  96% on RA   General appearance:    amb pleasant wf nad cough at insp / freq throat clearing    HEENT : Oropharynx  clear      Nasal turbinates nl    NECK :  without  apparent JVD/ palpable Nodes/TM    LUNGS: no acc muscle use,  Nl contour chest which is clear to A and P bilaterally without cough on insp or exp maneuvers   CV:  RRR  no s3 or murmur or increase in P2, and no edema   ABD:  soft and nontender with nl inspiratory excursion in the supine position. No bruits or organomegaly appreciated   MS:  Nl gait/ ext warm without deformities Or obvious joint restrictions  calf tenderness, cyanosis or clubbing    SKIN: warm and dry without lesions    NEURO:  alert, approp, nl sensorium with  no motor or cerebellar deficits apparent.        Assessment

## 2021-09-28 ENCOUNTER — Encounter: Payer: Self-pay | Admitting: Internal Medicine

## 2021-09-28 NOTE — Assessment & Plan Note (Signed)
Assoc with classic goiter and insp plateau on PFTs  10/07/20  - referred to [facility] ENT division 12/21/2020 > thyroidectomy Apr 18 2021 - gabapentin 100 mg tid 09/27/2021 >>>  Upper airway cough syndrome (previously labeled PNDS),  is so named because it's frequently impossible to sort out how much is  CR/sinusitis with freq throat clearing (which can be related to primary GERD)   vs  causing  secondary (" extra esophageal")  GERD from wide swings in gastric pressure that occur with throat clearing, often  promoting self use of mint and menthol lozenges that reduce the lower esophageal sphincter tone and exacerbate the problem further in a cyclical fashion.   These are the same pts (now being labeled as having "irritable larynx syndrome" by some cough centers) who not infrequently have a history of having failed to tolerate ace inhibitors,  dry powder inhalers or biphosphonates or report having atypical/extraesophageal reflux symptoms that don't respond to standard doses of PPI  and are easily confused as having aecopd or asthma flares by even experienced allergists/ pulmonologists (myself included).  rec titrate gabapentin to max of 300 mg tid / continue max rx for GERD

## 2021-09-28 NOTE — Assessment & Plan Note (Signed)
Onset of symptom p suspected covid march 2020 - 09/23/2020  After extensive coaching inhaler device,  effectiveness =    90% > true dulera 100 2bid and reflux meds and Prednisone 10 mg take  4 each am x 2 days,   2 each am x 2 days,  1 each am x 2 days and stop and pfts in one week  - PFT's  10/07/20 FEV1 2.10 (86 % ) ratio 0.79  p 8 % improvement from saba p symbicort prior to study with DLCO  27.83 (146%) corrects to 5.18 (118%)  for alv volume and FV curve slt flattening on insp and ERV = 35% @ wt 216     - 09/27/2021  After extensive coaching inhaler device,  effectiveness =    80% from a baseline near 0 > symbicort 80 2 bid and Prednisone 10 mg take  4 each am x 2 days,   2 each am x 2 days,  1 each am x 2 days and stop  - 09/27/2021 spirometry nl /  FEN0 128  Clear evidence for cough variant asthma by hx of steroid responsiveness and high FENO suggesting Th2 component > see rx as above and f/u in 6 weeks, sooner prn          Each maintenance medication was reviewed in detail including emphasizing most importantly the difference between maintenance and prns and under what circumstances the prns are to be triggered using an action plan format where appropriate.  Total time for H and P, chart review, counseling, reviewing hfa  device(s) and generating customized AVS unique to this office visit / same day charting = 34 min

## 2021-10-05 ENCOUNTER — Other Ambulatory Visit (HOSPITAL_COMMUNITY): Payer: Self-pay

## 2021-10-05 ENCOUNTER — Telehealth: Payer: Self-pay

## 2021-10-05 NOTE — Telephone Encounter (Signed)
Patient Advocate Encounter   Received notification that prior authorization for Pantoprazole Sodium 40MG  dr tablets is required.   PA submitted on 10/05/2021 Key : [id number] Status is pending

## 2021-10-10 NOTE — Telephone Encounter (Signed)
Patient Advocate Encounter  Received a fax regarding Prior Authorization from [facility] for Pantoprazole 40MG.   Authorization has been DENIED due to criteria not met. Patient must try and fail esomeprazole, lansoprazole, omeprazole.  Clista Bernhardt, CPhT Pharmacy Patient Advocate Specialist [place] Patient Advocate Team Phone: [phone]   Fax: [phone]

## 2021-11-10 ENCOUNTER — Ambulatory Visit: Payer: BC Managed Care – PPO | Admitting: Internal Medicine

## 2021-11-10 ENCOUNTER — Encounter: Payer: Self-pay | Admitting: Internal Medicine

## 2021-11-10 DIAGNOSIS — R058 Other specified cough: Secondary | ICD-10-CM

## 2021-11-10 DIAGNOSIS — J45991 Cough variant asthma: Secondary | ICD-10-CM | POA: Diagnosis not present

## 2021-11-10 NOTE — Assessment & Plan Note (Signed)
Assoc with classic goiter and insp plateau on PFTs  10/07/20  - referred to [facility] ENT division 12/21/2020 > thyroidectomy Apr 18 2021 - gabapentin 100 mg tid 09/27/2021 >>> never took it - 11/10/2021 wean off ppi in favor of bid pepcid due to concerns re long term rx   All symptoms resolved s need for cough suppression/ gabapentin  Discussed the recent press about ppi's in the context of a statistically significant (but questionably clinically relevant) increase in CRI in pts on ppi vs h2's > bottom line is the lowest dose of ppi that controls   gerd is the right dose and if that dose is zero that's fine esp since h2's are cheaper.    see avs for instructions unique to this ov          Each maintenance medication was reviewed in detail including emphasizing most importantly the difference between maintenance and prns and under what circumstances the prns are to be triggered using an action plan format where appropriate.  Total time for H and P, chart review, counseling, reviewing hfa device(s) and generating customized AVS unique to this office visit / same day charting = 26 min

## 2021-11-10 NOTE — Progress Notes (Signed)
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 Pt states dx with Asthma approx 10 years ago. She c/o increased coughing and wheezing off and on since 2020. She had been using her rescue inhaler 3-4 x per day until started Breo approx 1 month ago- now not using at all.   Dyspnea:  Not limited by breathing from desired activities  / L knee more limiting Cough: dry /immediate at hs and noisy/ better p cpap Sleep: fine with cpap SABA use: not using now Rec Stop breo Plan A = Automatic = Always=    symbicort 80 Take 2 puffs first thing in am and then another 2 puffs about 12 hours later.   Work on inhaler technique:  Prednisone 10 mg take  4 each am x 2 days,   2 each am x 2 days,  1 each am x 2 days and stop  Pantoprazole (protonix) 40 mg   Take  30-60 min before first meal of the day and Pepcid (famotidine)  20 mg after supper until return to office - this is the best way to tell whether stomach acid is contributing to your problem.     Plan B = Backup (to supplement plan A, not to replace it) Only use your albuterol inhaler as a rescue medication GERD diet reviewed, bed blocks rec    12/20/2020  f/u  ov/Nancy Valencia re: uacs/ TM    maint on symbicort 80 2bid   Chief Complaint  Patient presents with   Follow-up    Increased cough for the past week- prod with yellow sputum. She states she had been cough free prior to this for approx 3 wks. Currently on pred taper- 2 days remaining. She is using her albuterol inhaler one per day recently.   Dyspnea:  variable and seems better p prednisone each time Cough: immediately when hs/ better with cpap Sleeping: [name] cpap sleeping fine  SABA use: no more  than once a day  02: none  Covid status:   may have had covid, only vax x 1  Rec If get worse > Prednisone 10 mg take  4 each am x 2 days,   2 each am x 2 days,  1 each am x 2 days and stop and refills if needed Plan A = Automatic = Always=    symbicort 80 Take 2 puffs first thing in am and then another 2 puffs about 12 hours later.   Plan B = Backup (to supplement plan A, not to replace it) Only use your albuterol inhaler as a rescue medication   I will be referring you to [facility]/ ENT / voice  center to see Dr Harriette Ohara >>> thyroidectomy  Apr 18 2021   09/27/2021  f/u ov/Nancy Valencia re: uacs s/p thyroidectomy    maint on protonix ac / pepcid  bid due to mid afternoon  overt HB with freq throat clearing  Chief Complaint  Patient presents with   Follow-up    Pt states she is still coughing so hard her stomach hurts and it makes her have an asthma attack and not able eat.  Last prednisone dose 2 weeks prior to OV  per Dr Merri Brunette and has been off symbicort for a week - was clearly better on prednisone/ worse off it despite symbicort 80 but poor hfa technique noted - see a/p  Dyspnea:  as long as not coughing, no doe  Cough: no production now / worse w/in 15 of  supper   Sleeping: fine on left side/ cpap  SABA use: none today 02: none  Covid status:   vax x 2  Rec Symbicort 80 Take 2 puffs first thing in am and then another 2 puffs about 12 hours later.  Only use your albuterol as a rescue medication  to be used if you can't catch your breath Work on inhaler technique:   Gabapentin 100 mg three times ok to adjust up or down to find a dose that controls the cough  Prednisone 10 mg take  4 each am x 2 days,   2 each am x 2 days,  1 each am x 2 days and stop      11/10/2021  f/u ov/Nancy Valencia re: uacs maint on symbicort 80 2bid and no gabapentin  / ppi q am no pepcid  Chief Complaint  Patient presents with   Follow-up    Follow up. Patient has no complaints.   Dyspnea:  Not limited by breathing from desired activities   Cough: as long as avoids pinehapple  Sleeping: flat bed sev pillows/ cpap  SABA use: none  02: none      No obvious day to day or daytime variability or assoc excess/ purulent sputum or mucus plugs or hemoptysis or cp or chest tightness, subjective wheeze or overt sinus or hb symptoms.   Sleeping  without nocturnal  or early am exacerbation  of respiratory  c/o's or need for noct saba. Also denies any obvious fluctuation of symptoms with weather or environmental changes or other aggravating or alleviating factors except as outlined above   No unusual exposure hx or h/o childhood pna/ asthma or knowledge of premature birth.  Current Allergies, Complete Past Medical History, Past Surgical History, Family History, and Social History were reviewed in [employer] record.  ROS  The following are not active complaints unless bolded Hoarseness, sore throat, dysphagia, dental problems, itching, sneezing,  nasal congestion or discharge of excess mucus or purulent secretions, ear ache,   fever, chills, sweats, unintended wt loss or wt gain, classically pleuritic or exertional cp,  orthopnea pnd or arm/hand swelling  or leg swelling, presyncope, palpitations, abdominal pain, anorexia, nausea, vomiting, diarrhea  or change in bowel habits or change in bladder habits, change in stools or change in urine, dysuria, hematuria,  rash, arthralgias, visual complaints,  headache, numbness, weakness or ataxia or problems with walking or coordination,  change in mood or  memory.        Current Meds  Medication Sig   albuterol (VENTOLIN HFA) 108 (90 Base) MCG/ACT inhaler Inhale 2 puffs into the lungs every 4 (four) hours as needed  for wheezing or shortness of breath.   aspirin 81 MG chewable tablet Chew by mouth daily.   budesonide-formoterol (SYMBICORT) 80-4.5 MCG/ACT inhaler Take 2 puffs first thing in am and then another 2 puffs about 12 hours later.   calcium carbonate (OS-CAL) 600 MG TABS tablet Take 600 mg by mouth 2 (two) times daily with a meal.   Cholecalciferol (VITAMIN D) 2000 UNITS tablet Take 2,000 Units by mouth daily.   famotidine (PEPCID) 20 MG tablet One after supper   folic acid (FOLVITE) 1 MG tablet Take 1 mg by mouth daily.   Glucosamine-Chondroit-Vit C-Mn (GLUCOSAMINE 1500 COMPLEX PO) Take by mouth.   levocetirizine (XYZAL) 5 MG tablet Take 5 mg by mouth every evening.   pantoprazole (PROTONIX) 40 MG tablet Take 1 tablet (40 mg total) by mouth daily. Take 30-60 min before first meal of the day   Thyroid (NATURE-THROID PO) Take 1 tablet by mouth daily.   vitamin C (ASCORBIC ACID) 500 MG tablet Take 500 mg by mouth daily.                 Past Medical History:  Diagnosis Date   Angio-edema    Hypothyroid    Obesity    Sleep apnea    Thyroid disease    Urticaria        Objective:    Wts  11/10/2021       210   09/27/2021      207    12/20/20 219 lb (99.3 kg)  09/23/20 219 lb 3.2 oz (99.4 kg)  10/19/16 212 lb 9.6 oz (96.4 kg)    Vital signs reviewed  11/10/2021  - Note at rest 02 sats  97% on RA   General appearance:    amb mod obese wf nad   HEENT : Oropharynx  clear        NECK :  without  apparent JVD/ palpable Nodes/TM    LUNGS: no acc muscle use,  Nl contour chest which is clear to A and P bilaterally without cough on insp or exp maneuvers   CV:  RRR  no s3 or murmur or increase in P2, and no edema   ABD:   soft and nontender with nl inspiratory excursion   MS:  Nl gait/ ext warm without deformities Or obvious joint restrictions  calf tenderness, cyanosis or clubbing    SKIN: warm and dry without lesions    NEURO:  alert, approp, nl sensorium with  no motor or cerebellar deficits apparent.         Assessment

## 2021-11-10 NOTE — Assessment & Plan Note (Signed)
Onset of symptom p suspected covid march 2020 - 09/23/2020  After extensive coaching inhaler device,  effectiveness =    90% > true dulera 100 2bid and reflux meds and Prednisone 10 mg take  4 each am x 2 days,   2 each am x 2 days,  1 each am x 2 days and stop and pfts in one week  - PFT's  10/07/20 FEV1 2.10 (86 % ) ratio 0.79  p 8 % improvement from saba p symbicort prior to study with DLCO  27.83 (146%) corrects to 5.18 (118%)  for alv volume and FV curve slt flattening on insp and ERV = 35% @ wt 216   - 09/27/2021  After extensive coaching inhaler device,  effectiveness =    80% from a baseline near 0 > symbicort 80 2 bid and Prednisone 10 mg take  4 each am x 2 days,   2 each am x 2 days,  1 each am x 2 days and stop  - 09/27/2021 spirometry nl /  FEN0 128  All goals of chronic asthma control met including optimal function and elimination of symptoms with minimal need for rescue therapy.  Contingencies discussed in full including contacting this office immediately if not controlling the symptoms using the rule of two's.     Ok to drop down to symb 80 1 bid but not lower based on prior pred response/feno and Based on two studies from NEJM  378; 20 p 1865 (2018) and 380 : p2020-30 (2019) in pts with mild asthma it is reasonable to use low dose symbicort eg 80 2bid "prn" flare in this setting but I emphasized this was only shown with symbicort and takes advantage of the rapid onset of action but is not the same as "rescue therapy" but can be stopped once the acute symptoms have resolved and the need for rescue has been minimized (< 2 x weekly)    F/u q 6 m, sooner if needed

## 2021-11-10 NOTE — Patient Instructions (Addendum)
Ok to try taper symbicort 80 Take 1- 2 puffs first thing in am and then another 2 puffs about 12 hours later.   Ok to try pepcid 20 mg after bfast and supper for several to help you come off Protonix    Please schedule a follow up visit in 6  months but call sooner if needed

## 2021-11-24 DIAGNOSIS — E039 Hypothyroidism, unspecified: Secondary | ICD-10-CM | POA: Diagnosis not present

## 2021-11-24 DIAGNOSIS — E890 Postprocedural hypothyroidism: Secondary | ICD-10-CM | POA: Diagnosis not present

## 2021-12-14 DIAGNOSIS — E890 Postprocedural hypothyroidism: Secondary | ICD-10-CM | POA: Diagnosis not present

## 2022-05-15 ENCOUNTER — Ambulatory Visit: Payer: BC Managed Care – PPO | Admitting: Internal Medicine

## 2023-03-08 IMAGING — CR DG CHEST 2V
2 series · 2 of 2 positions shown · non-contrast
Comparison: 10/19/2016

CLINICAL DATA: Increasing shortness of breath since May 2020

EXAM:
CHEST - 2 VIEW

[w chest pa]
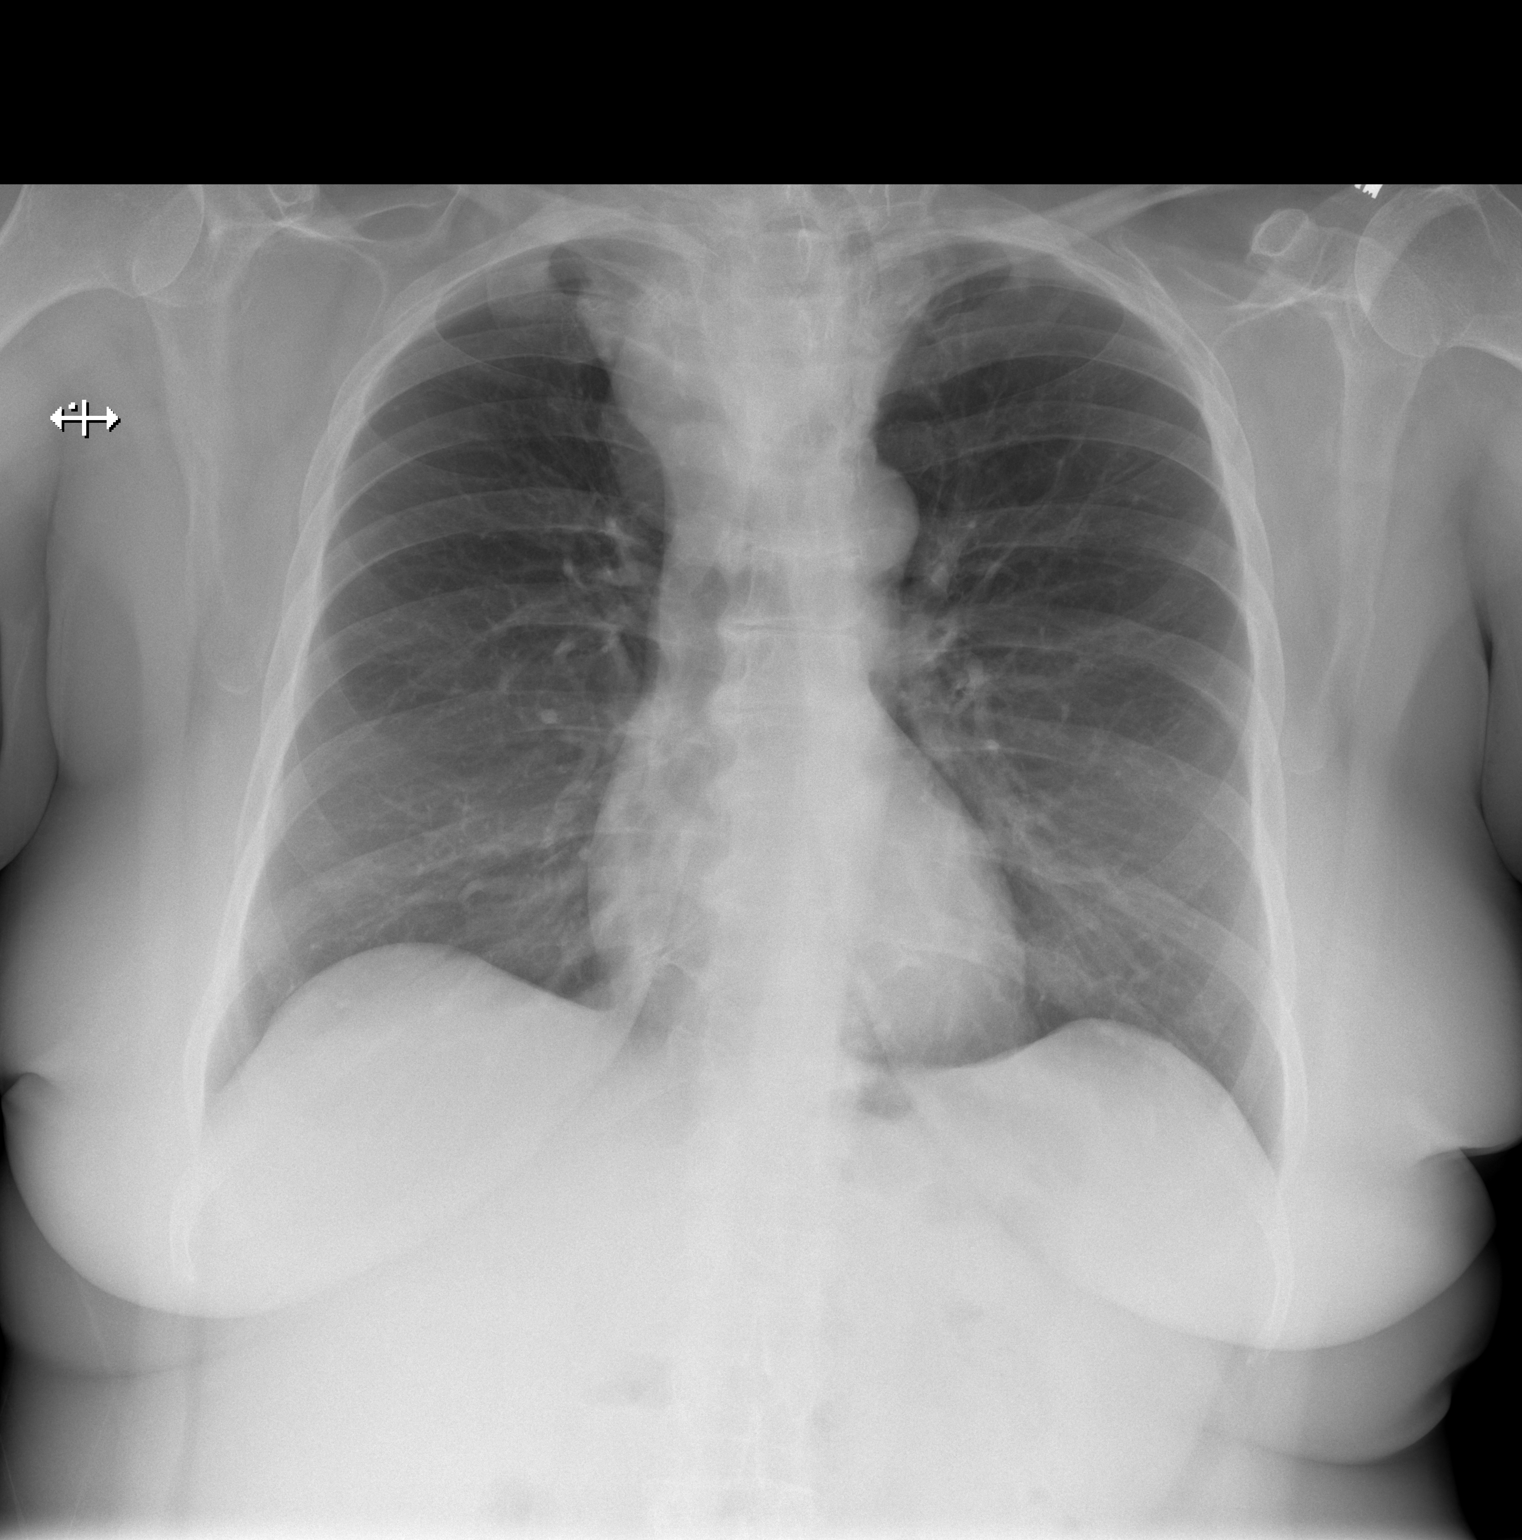

[w chest lat]
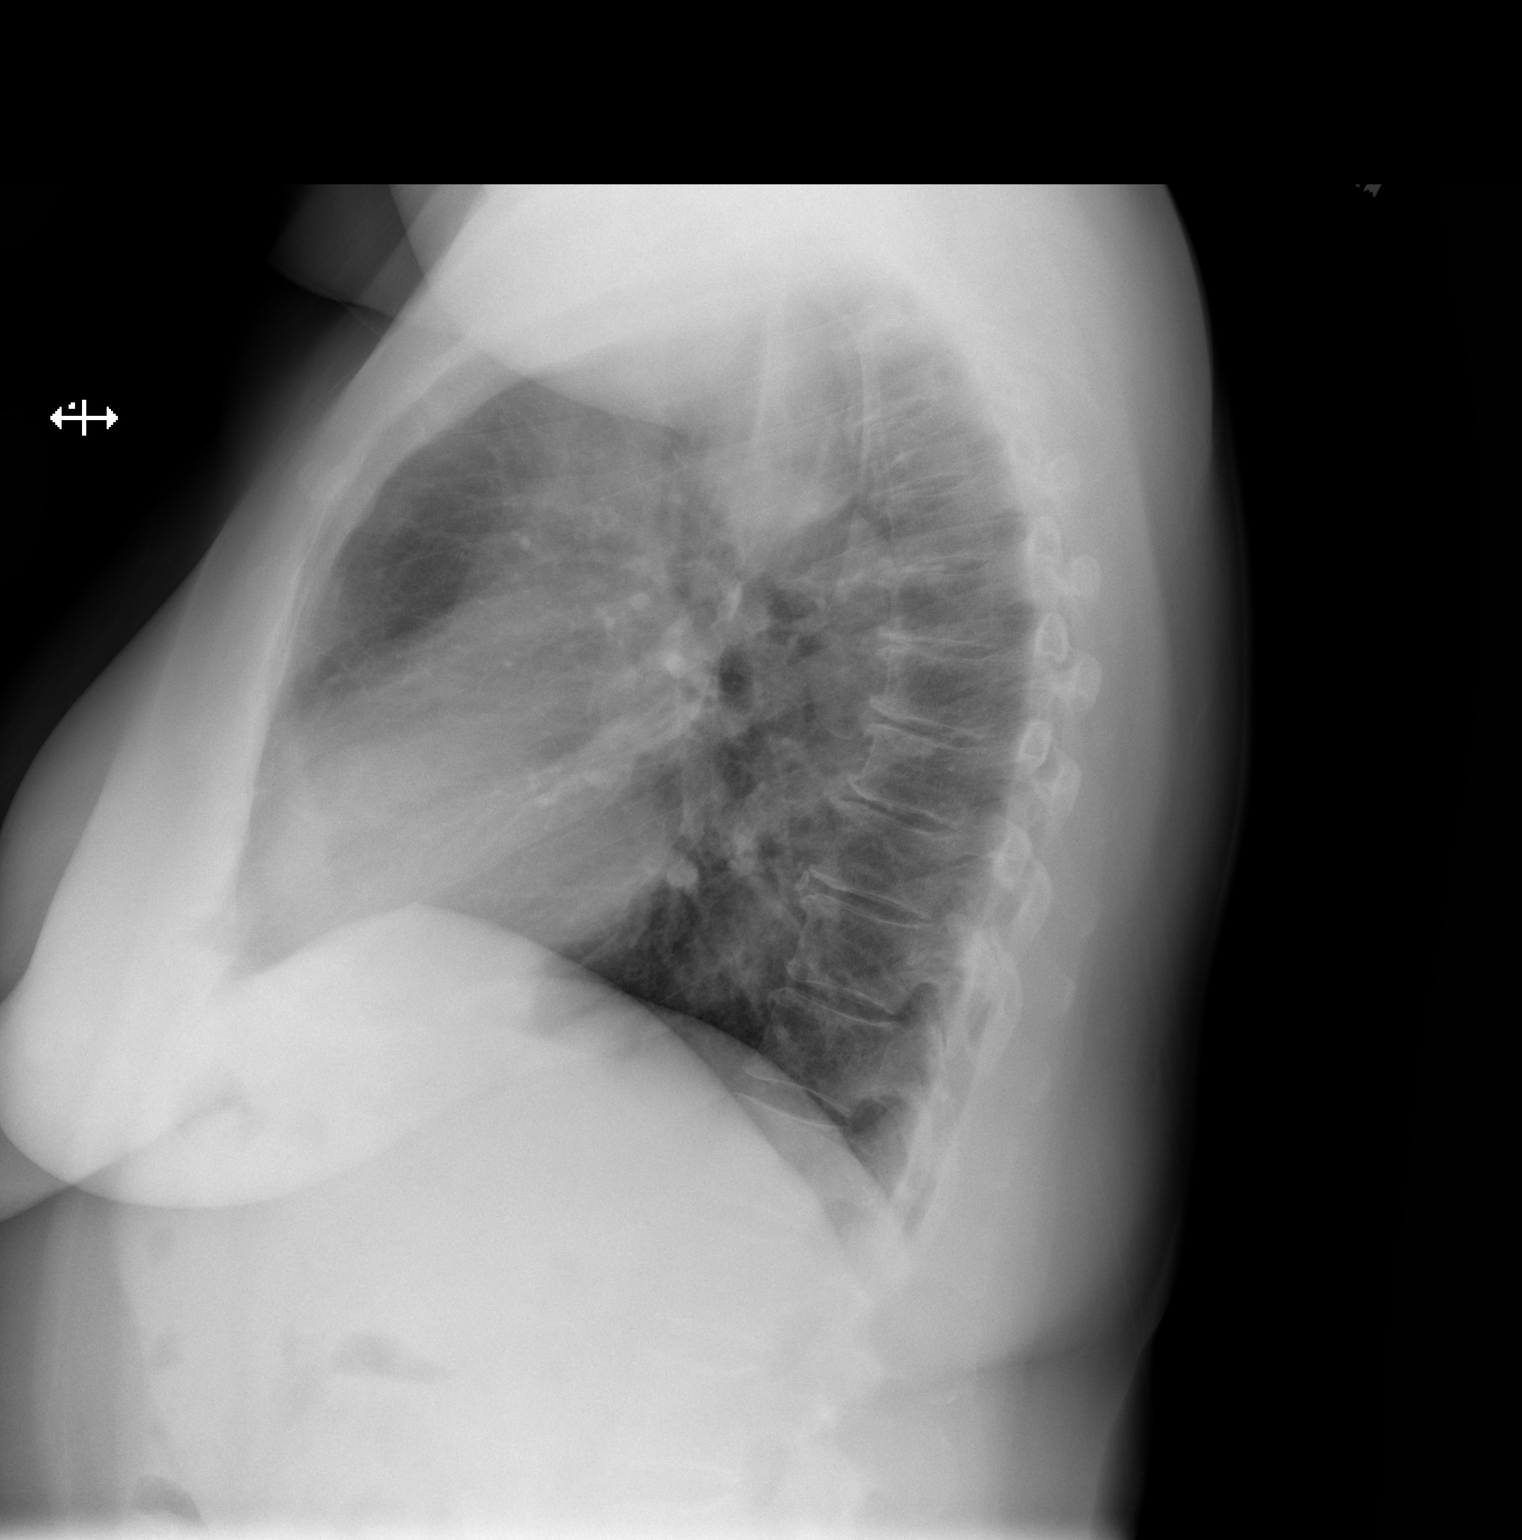

[2 of 2 positions shown; findings below may reference images not displayed]

FINDINGS: Normal heart size and pulmonary vascularity.

Mild increase in size of RIGHT paratracheal mass with significant
mass effect upon the trachea RIGHT to LEFT with transverse
narrowing.

Prior thyroid ultrasound exam demonstrated a 5.3 cm diameter RIGHT
thyroid mass.

Lungs clear.

No pulmonary infiltrate, pleural effusion, or pneumothorax.

Osseous structures unremarkable.
IMPRESSION: No acute abnormalities.

Marked mass effect upon the RIGHT lateral aspect of the trachea by a
RIGHT superior mediastinal/inferior cervical mass, patient with
known thyroid mass, appears slightly increased in size since
previous study; recommend correlation with physical exam and
consider follow-up thyroid ultrasound.
